# Patient Record
Sex: Female | Born: 1965 | Race: White | Hispanic: No | Marital: Married | Smoking: Never smoker
Health system: Southern US, Community
[De-identification: ages and names within clinical notes are randomized; demographics above are authoritative.]

## PROBLEM LIST (undated history)

## (undated) DIAGNOSIS — D649 Anemia, unspecified: Secondary | ICD-10-CM

## (undated) DIAGNOSIS — R7989 Other specified abnormal findings of blood chemistry: Secondary | ICD-10-CM

## (undated) DIAGNOSIS — Z973 Presence of spectacles and contact lenses: Secondary | ICD-10-CM

## (undated) HISTORY — PX: WISDOM TOOTH EXTRACTION: SHX21

## (undated) HISTORY — DX: Anemia, unspecified: D64.9

## (undated) HISTORY — DX: Other specified abnormal findings of blood chemistry: R79.89

## (undated) HISTORY — PX: GYNECOLOGIC CRYOSURGERY: SHX857

## (undated) HISTORY — PX: TONSILLECTOMY AND ADENOIDECTOMY: SHX28

## (undated) HISTORY — PX: CONVERTED PROCEDURE: SHX9999

## (undated) SURGERY — Surgical Case
Anesthesia: *Unknown

---

## 1997-12-20 HISTORY — PX: EYE SURGERY: SHX253

## 2000-05-31 ENCOUNTER — Other Ambulatory Visit: Admission: RE | Admit: 2000-05-31 | Discharge: 2000-05-31 | Payer: Self-pay | Admitting: Obstetrics and Gynecology

## 2001-06-28 ENCOUNTER — Other Ambulatory Visit: Admission: RE | Admit: 2001-06-28 | Discharge: 2001-06-28 | Payer: Self-pay | Admitting: Obstetrics and Gynecology

## 2002-05-09 ENCOUNTER — Other Ambulatory Visit: Admission: RE | Admit: 2002-05-09 | Discharge: 2002-05-09 | Payer: Self-pay | Admitting: Obstetrics and Gynecology

## 2003-05-21 ENCOUNTER — Other Ambulatory Visit: Admission: RE | Admit: 2003-05-21 | Discharge: 2003-05-21 | Payer: Self-pay | Admitting: Obstetrics and Gynecology

## 2003-10-04 ENCOUNTER — Ambulatory Visit (HOSPITAL_COMMUNITY): Admission: RE | Admit: 2003-10-04 | Discharge: 2003-10-04 | Payer: Self-pay | Admitting: Obstetrics and Gynecology

## 2003-10-04 ENCOUNTER — Encounter: Payer: Self-pay | Admitting: Obstetrics and Gynecology

## 2004-05-26 ENCOUNTER — Other Ambulatory Visit: Admission: RE | Admit: 2004-05-26 | Discharge: 2004-05-26 | Payer: Self-pay | Admitting: Obstetrics and Gynecology

## 2004-10-05 ENCOUNTER — Ambulatory Visit (HOSPITAL_COMMUNITY): Admission: RE | Admit: 2004-10-05 | Discharge: 2004-10-05 | Payer: Self-pay | Admitting: Obstetrics and Gynecology

## 2005-07-13 ENCOUNTER — Other Ambulatory Visit: Admission: RE | Admit: 2005-07-13 | Discharge: 2005-07-13 | Payer: Self-pay | Admitting: Obstetrics and Gynecology

## 2006-08-29 ENCOUNTER — Other Ambulatory Visit: Admission: RE | Admit: 2006-08-29 | Discharge: 2006-08-29 | Payer: Self-pay | Admitting: Obstetrics and Gynecology

## 2006-12-06 ENCOUNTER — Ambulatory Visit (HOSPITAL_COMMUNITY): Admission: RE | Admit: 2006-12-06 | Discharge: 2006-12-06 | Payer: Self-pay | Admitting: Obstetrics and Gynecology

## 2007-10-24 ENCOUNTER — Other Ambulatory Visit: Admission: RE | Admit: 2007-10-24 | Discharge: 2007-10-24 | Payer: Self-pay | Admitting: Obstetrics and Gynecology

## 2008-02-22 ENCOUNTER — Other Ambulatory Visit: Payer: Self-pay | Admitting: Gastroenterology

## 2008-02-23 LAB — HM MAMMOGRAPHY

## 2008-11-25 ENCOUNTER — Other Ambulatory Visit: Admission: RE | Admit: 2008-11-25 | Discharge: 2008-11-25 | Payer: Self-pay | Admitting: Obstetrics and Gynecology

## 2010-10-08 ENCOUNTER — Ambulatory Visit: Payer: Self-pay | Admitting: Primary Care

## 2010-10-11 NOTE — Progress Notes (Signed)
 Reason For Visit   Ms. Carrie Munoz is in the office today with complaints of having red eyes, cough   and cold for the past week.  HPI   Healthy 44 yo WF returns to discuss persistent redness in L eye.  She did   have some preceding URI sx, with nasal congestion/drip, stuffiness,   scratchy throat/cough, but that's subsiding.  Never feverish, no purulent   secretions.  Now noting redness mainly medial to iris, only L eye, more   than a week.  No sticky secretions upon awakening, minimal itch/discomfort.    Vision seems okay, no photophobia.     Meds reviewed.  Allergies   No Known Drug Allergy.  Current Meds   Centrum Tablet;TAKE 1 TABLET DAILY.; RPT.  Personal Hx   Nonsmoker, no specific sick exposure.  Vital Signs   Recorded by [email or web address] on 08 Oct 2010 12:53 PM  BP:105/58,  LUE,  Sitting,   HR: 68 b/min,  Apical, Regular,   Resp: 16 r/min, Normal.  Physical Exam   PERL, no apparent medial opacity on funduscopic exam, no asymmetry of   subjective sensation or objective pressure with palpation of eyes through   closed lids.  Erythema is medial as above, lids wnl, no exudate or regional   adenopathy apparent.  Assessment   Urged to have prompt ophth eval, as cannot explain this as simple viral   syndrome given focal nature, persistence, minimal sx.  Need to r/o deeper   inflammatory process in need of intervention.  No systemic process apparent   for now.  She has eye specialist and will call them herself, let me know if   unable to get in this week.  Signature   Electronically signed by: Rosezena Sensor  M.D.; 10/11/2010 11:53 AM EST;   Chartered loss adjuster.

## 2010-12-23 LAB — HM PAP SMEAR

## 2011-01-14 ENCOUNTER — Ambulatory Visit: Payer: Self-pay | Admitting: Primary Care

## 2011-02-10 ENCOUNTER — Ambulatory Visit: Payer: Self-pay | Admitting: Primary Care

## 2011-02-10 ENCOUNTER — Encounter: Payer: Self-pay | Admitting: Primary Care

## 2011-02-10 NOTE — Progress Notes (Signed)
 Reason For Visit   Carrie Munoz is in the office today with complaints of having headaches once   monthly at the same time of month lasting 10-12 hours with nausea, photo   sensitivity, sound sensitivity for at least the past 3 months.  HPI   [name] has had episodic headaches of the current variety for many years,   back to her teens.  They come sporadically, with no clear relation to   menses or intake that she has been able to discern.  She often awakens with   them, finds that otc analgesic may help if taken in time, but otherwise   becomes photophobic with N/V, inability to keep anything down, losing most   of the day before easing overnight.     She notes no visual aura.  She notes no focal motor or sensory deficits   during episodes, and does recover completely in between.  There have been   more frequent episodes in recent months, prompting this visit.  She's never   tried Rx.     Mother and sister also suffer with bad headaches.  Allergies   No Known Drug Allergy.  Current Meds   Centrum Tablet;TAKE 1 TABLET DAILY.; RPT.  Personal Hx   Nonsmoker, not much caffeine, and social alcohol minimal.  Sleep cycles may   be erratic, and stressors perhaps trigger?.  ROS   Menses have largely ceased in the past year, erratic prior to that.  No   change in baseline visual acuity, speech, coordination, strength, energy or   appetite.  Vital Signs   Recorded by [email or web address] on 10 Feb 2011 10:53 AM  BP:102/80,  LUE,  Sitting,   HR: 64 b/min,  Apical, Regular.  Physical Exam   Normal central fundi, pupils, EOM's.  Face symmetric.  Neck supple.  Nl   tone and strength functionally, and no apparent sensory deficits, nl gait.    Fully alert, articulate, attentive.  Assessment   Common migraine, uncomplicated to date.  She'd like triptan trial, and is   not sure of ins pharmacy coverage.  Discussed options of injectible, MLT,   nasal spray.  She prefers the MLT if can afford, and will check with   pharmacist re benefits, out of  pocket expense, get back to me.     Discussed HIV testing per [place].  She did have transfusion many years ago,   and also was turned down by [employer] on last occasion because of   conflicting, inconclusive results on blood screen.  Will update hepatitis   virus serol and get HIV, after consent signed today.  Signature   Electronically signed by: Rosezena Sensor  M.D.; 02/10/2011 6:13 PM EST; Chartered loss adjuster.

## 2011-02-11 LAB — HEPATITIS B SURFACE ANTIGEN: HBV S Ag: NEGATIVE

## 2011-02-11 LAB — HEPATITIS B SURFACE ANTIBODY
HBV S Ab Quant: 0 IU/L
HBV S Ab: NEGATIVE

## 2011-02-11 LAB — HIV-1 AND 2 AB: HIV 1&2 Ab screen: NEGATIVE

## 2011-02-11 LAB — HEPATITIS C ANTIBODY: Hep C Ab: NEGATIVE

## 2011-02-15 ENCOUNTER — Other Ambulatory Visit: Payer: Self-pay | Admitting: Obstetrics and Gynecology

## 2011-02-15 DIAGNOSIS — Z1231 Encounter for screening mammogram for malignant neoplasm of breast: Secondary | ICD-10-CM

## 2011-02-19 ENCOUNTER — Ambulatory Visit (HOSPITAL_COMMUNITY)
Admission: RE | Admit: 2011-02-19 | Discharge: 2011-02-19 | Disposition: A | Discharge status: Home / Self Care | Payer: BC Managed Care – PPO | Source: Ambulatory Visit | Attending: Obstetrics and Gynecology | Admitting: Obstetrics and Gynecology

## 2011-02-19 DIAGNOSIS — Z1231 Encounter for screening mammogram for malignant neoplasm of breast: Secondary | ICD-10-CM

## 2011-08-12 ENCOUNTER — Telehealth: Payer: Self-pay | Admitting: Primary Care

## 2011-08-12 NOTE — Telephone Encounter (Signed)
appt scheduled

## 2011-08-12 NOTE — Telephone Encounter (Signed)
We would need to meet and discuss the initiation of any new Rx.  Thanks.

## 2011-08-12 NOTE — Telephone Encounter (Signed)
Seila called with concerns of having migraine headaches once monthly.  Not having mensus.  Spoke with friends who take imitrex and she wants to know if she can have Rx for it???  Apt or Rx to pharmacy of record??  Allayne Stack

## 2011-08-26 ENCOUNTER — Encounter: Payer: Self-pay | Admitting: Primary Care

## 2011-08-26 ENCOUNTER — Ambulatory Visit: Payer: Self-pay | Admitting: Primary Care

## 2011-08-26 VITALS — BP 108/76 | HR 66 | Ht 62.0 in | Wt 152.0 lb

## 2011-08-26 DIAGNOSIS — G43009 Migraine without aura, not intractable, without status migrainosus: Secondary | ICD-10-CM

## 2011-08-26 MED ORDER — SUMATRIPTAN SUCCINATE 25 MG PO TABS *I*
25.0000 mg | ORAL_TABLET | ORAL | Status: AC | PRN
Start: 2011-08-26 — End: ?

## 2011-08-28 DIAGNOSIS — G43009 Migraine without aura, not intractable, without status migrainosus: Secondary | ICD-10-CM | POA: Insufficient documentation

## 2011-08-28 NOTE — Progress Notes (Signed)
Subjective:     Patient ID: Carrie Munoz is a 45 y.o. female.    HPI  With h/o headaches for years, never clearly related to menses or any other specific trigger, but sometimes disabling with photophobia, N/V and "a lost day", she has often gotten some relief from OTC analgesic if taken in time.  A recent episode was while with friend that had Imitrex; Carrie Munoz took a 25 mg dose and was amazed at how well it worked, the HA totally gone within 45 min and never returned.  She would like her own Rx.    Caffeine is 3-4c daily.  She has addressed sleep, keeping all seven nights the same, "good sleeper".  She rarely uses analgesic, so no rebound relevant.  Alcohol minimal/rare, and many years since smoked.    Her headaches have no prodrome of visual or focal neurol flavor.  She's had them for years, but never tried Rx.    Patient's medications, allergies, past medical, surgical, social and family histories were reviewed and updated as appropriate.  No new stressors.    Review of Systems  No change in menses, no dizziness/vertigo, no exertional sx of concern and no recent visual spells/loss.        Objective:   Physical Exam  Normal carotid pulses without bruit, normal BP as above.        Assessment:      Common migraine, apparent response to low-dose sumatriptan.  We discussed the very rare vascular side effects, and her CV risk profile makes such a surprise highly unlikely.  Reviewed headache hygiene, option of caffeine weaning.  Infrequent headaches, so no prophylaxis for now.    Gr than 50% of this 25+ min visit was counsel.        Plan:      As above.*

## 2013-03-12 ENCOUNTER — Other Ambulatory Visit: Payer: Self-pay | Admitting: Obstetrics and Gynecology

## 2013-03-12 DIAGNOSIS — Z1231 Encounter for screening mammogram for malignant neoplasm of breast: Secondary | ICD-10-CM

## 2013-03-16 ENCOUNTER — Ambulatory Visit (HOSPITAL_COMMUNITY)
Admission: RE | Admit: 2013-03-16 | Discharge: 2013-03-16 | Disposition: A | Discharge status: Home / Self Care | Payer: BC Managed Care – PPO | Source: Ambulatory Visit | Attending: Obstetrics and Gynecology | Admitting: Obstetrics and Gynecology

## 2013-03-16 DIAGNOSIS — Z1231 Encounter for screening mammogram for malignant neoplasm of breast: Secondary | ICD-10-CM | POA: Insufficient documentation

## 2013-03-16 LAB — HM MAMMOGRAPHY

## 2013-06-28 ENCOUNTER — Telehealth: Payer: Self-pay | Admitting: Obstetrics and Gynecology

## 2013-06-28 NOTE — Telephone Encounter (Signed)
Please call patient once done

## 2013-06-28 NOTE — Telephone Encounter (Signed)
Patient request birth control refills due to new insurance. [facility] needs [facility]

## 2013-06-28 NOTE — Telephone Encounter (Signed)
Spoke to patient who now uses [employer] delivery. Pt uses trisprintec & mail order will only give her trisprintec generic. Pt says she cant use the generic because when she did in the past, she had a lot of trouble with it & had bleeding. The mail order pharmacy told her that her drs office needs to call them at [phone] & ask for a mail order override so that way her [employer] will cover it for her to get it at her local pharmacy ([facility] [address]) so she can get the brand name Trisprintec at a lower price. Patient would like a call placed to [employer] at the number above. Pts last aex was 02/16/13 with Dr Tresa Res

## 2013-06-29 MED ORDER — NORGESTIM-ETH ESTRAD TRIPHASIC 0.18/0.215/0.25 MG-35 MCG PO TABS: 1.0000 | ORAL_TABLET | Freq: Every day | ORAL | Status: DC

## 2013-06-29 NOTE — Telephone Encounter (Signed)
Has pt had an AnEx recently and why doesn't she have an active rx for her OC'S?

## 2013-06-29 NOTE — Telephone Encounter (Signed)
This message stream isn't seen on the med refill request, so I didn't know this.  So yes, it's all fine to refill the rx.

## 2013-06-29 NOTE — Telephone Encounter (Signed)
See phone note below from patient. [employer] insurance called as requested per patient. Was told by representative that no mail order override was needed. [facility] [address] notified of this. Pharmacy to run Tri-sprintec through and notify patient.   patient last AEX 02/16/2013

## 2013-06-29 NOTE — Telephone Encounter (Signed)
Rx for Tri-sprintec  [facility] [address]. Paper chart shows last AEX 01/2013. Patient now has new insurance that required over ride per patient.

## 2013-07-12 ENCOUNTER — Telehealth: Payer: Self-pay

## 2013-08-03 ENCOUNTER — Telehealth: Payer: Self-pay | Admitting: *Deleted

## 2013-08-03 NOTE — Telephone Encounter (Signed)
[facility] States the Rx for TriSprentec does not require an mail order override or PA.

## 2013-08-03 NOTE — Telephone Encounter (Signed)
Left message on CB# of need to call office concerning Rx medication TriSpentec brand name only.

## 2013-08-10 ENCOUNTER — Telehealth: Payer: Self-pay | Admitting: Obstetrics and Gynecology

## 2013-08-10 NOTE — Telephone Encounter (Signed)
Patient is calling Natasha Villegas with a few more questions and an update pertaining to her prescription and [employer].

## 2013-08-10 NOTE — Telephone Encounter (Signed)
Routing to Sue.

## 2013-08-14 NOTE — Telephone Encounter (Signed)
LMTCB  aa 

## 2014-01-25 ENCOUNTER — Telehealth: Payer: Self-pay | Admitting: *Deleted

## 2014-01-25 MED ORDER — NORGESTIM-ETH ESTRAD TRIPHASIC 0.18/0.215/0.25 MG-35 MCG PO TABS: 1.0000 | ORAL_TABLET | Freq: Every day | ORAL | Status: DC

## 2014-01-25 NOTE — Telephone Encounter (Signed)
Last refilled: 06/29/13 #1 pack with 7 refills Last AEX: 02/16/13 Current AEX: 02/20/14 with Dr. Newton Pigg Previfem #28/0 refills sent to last patient until AEX  Please Advise.  Encounter Closed.

## 2014-02-20 ENCOUNTER — Ambulatory Visit: Payer: Self-pay | Admitting: Obstetrics and Gynecology

## 2014-02-20 ENCOUNTER — Encounter: Payer: Self-pay | Admitting: Obstetrics and Gynecology

## 2014-02-20 ENCOUNTER — Ambulatory Visit (INDEPENDENT_AMBULATORY_CARE_PROVIDER_SITE_OTHER): Payer: Managed Care, Other (non HMO) | Admitting: Obstetrics and Gynecology

## 2014-02-20 VITALS — BP 130/84 | HR 84 | Ht 65.25 in | Wt 144.0 lb

## 2014-02-20 DIAGNOSIS — Z0000 Encounter for general adult medical examination without abnormal findings: Secondary | ICD-10-CM

## 2014-02-20 DIAGNOSIS — Z01419 Encounter for gynecological examination (general) (routine) without abnormal findings: Secondary | ICD-10-CM

## 2014-02-20 DIAGNOSIS — R8281 Pyuria: Secondary | ICD-10-CM

## 2014-02-20 DIAGNOSIS — R82998 Other abnormal findings in urine: Secondary | ICD-10-CM

## 2014-02-20 LAB — POCT URINALYSIS DIPSTICK
Bilirubin, UA: NEGATIVE
Blood, UA: NEGATIVE
Glucose, UA: NEGATIVE
Ketones, UA: NEGATIVE
NITRITE UA: NEGATIVE
PROTEIN UA: NEGATIVE
UROBILINOGEN UA: NEGATIVE
pH, UA: 5

## 2014-02-20 MED ORDER — NORGESTIM-ETH ESTRAD TRIPHASIC 0.18/0.215/0.25 MG-35 MCG PO TABS: 1.0000 | ORAL_TABLET | Freq: Every day | ORAL | Status: DC

## 2014-02-20 NOTE — Patient Instructions (Signed)

## 2014-02-20 NOTE — Progress Notes (Signed)
Patient ID: Natasha Villegas, female   DOB: 08/05/1966, 48 y.o.   MRN: [id number] 48 y.o.   Married    Caucasian   female   G2P2003   here for annual exam.   Happy on OCPs.  Menses a little heavier.   Patient's last menstrual period was 02/07/2014.          Sexually active: yes  The current method of family planning is Trisprintec. Exercising:  None Last mammogram:  03/16/13, WNL [facility]  Prefers to do every two years.  Last pap smear:  12/23/10, WNL History of abnormal pap:  none Smoking:  never Alcohol:  none Last colonoscopy:  N/A Last Bone Density:  N/A Last tetanus shot:  08/2011 Last cholesterol check:  01/2013.    Declines blood work today.   Hgb:                Urine:  1+ leuks, pH 5.0 - asymptomatic.   Family History  Problem Relation Age of Onset  . Hypertension Father   . Heart attack Father     bypass surgery    There are no active problems to display for this patient.   No past medical history on file.  Past Surgical History  Procedure Laterality Date  . Gynecologic cryosurgery      d/t heavy vaginal discharge  . Tonsillectomy and adenoidectomy    . Wisdom tooth extraction      Allergies: Review of patient's allergies indicates no known allergies.  Current Outpatient Prescriptions  Medication Sig Dispense Refill  . Norgestimate-Ethinyl Estradiol Triphasic (TRI-SPRINTEC) 0.18/0.215/0.25 MG-35 MCG tablet Take 1 tablet by mouth daily. Brand name only per patient request. 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  1 Package  0   No current facility-administered medications for this visit.    ROS: Pertinent items are noted in HPI.  Social Hx:  Married.   Exam:    BP 130/84  Pulse 84  Ht 5' 5.25" (1.657 m)  Wt 144 lb (65.318 kg)  BMI 23.79 kg/m2  LMP 02/07/2014   Wt Readings from Last 3 Encounters:  02/20/14 144 lb (65.318 kg)     Ht Readings from Last 3 Encounters:  02/20/14 5' 5.25" (1.657 m)    General appearance: alert, cooperative and appears stated  age Head: Normocephalic, without obvious abnormality, atraumatic Neck: no adenopathy, supple, symmetrical, trachea midline and thyroid not enlarged, symmetric, no tenderness/mass/nodules Lungs: clear to auscultation bilaterally Breasts: Inspection negative, No nipple retraction or dimpling, No nipple discharge or bleeding, No axillary or supraclavicular adenopathy, Normal to palpation without dominant masses Heart: regular rate and rhythm Abdomen: soft, non-tender; bowel sounds normal; no masses,  no organomegaly Extremities: extremities normal, atraumatic, no cyanosis or edema Skin: Skin color, texture, turgor normal. No rashes or lesions Lymph nodes: Cervical, supraclavicular, and axillary nodes normal. No abnormal inguinal nodes palpated Neurologic: Grossly normal   Pelvic: External genitalia:  no lesions              Urethra:  normal appearing urethra with no masses, tenderness or lesions              Bartholins and Skenes: normal                 Vagina: normal appearing vagina with normal color and discharge, no lesions              Cervix: large ectropion.  Bleeds easily with pap.  Nodular anterior cervix.  1.0 cm  anterior LUS fibroid.               Pap taken: yes and HR HPV        Bimanual Exam:  Uterus:  uterus is normal size, shape, consistency and nontender                                      Adnexa: normal adnexa in size, nontender and no masses                                      Rectovaginal: Confirms                                      Anus:  normal sphincter tone, no lesions  A: normal menopausal exam Pyuria.  Possible UTI. Cervical hood?    P:     Mammogram recommended yearly starting at age 46. I will renew Rx for Trisprintec for one year.  See EPIC. I discussed with patient that if she declines doing mammogram yearly starting next year, we may need to discontinue her hormonal birth control. pap smear and high risk HPV testing today.  Urine culture. return  annually or prn     An After Visit Summary was printed and given to the patient.

## 2014-02-21 LAB — IPS PAP TEST WITH HPV

## 2014-02-21 LAB — URINE CULTURE
COLONY COUNT: NO GROWTH
Organism ID, Bacteria: NO GROWTH

## 2014-02-22 ENCOUNTER — Telehealth: Payer: Self-pay

## 2014-02-22 NOTE — Telephone Encounter (Signed)
Message copied by Lowella Fairy on Fri Feb 22, 2014 11:14 AM ------      Message from: [place], BROOK E      Created: Fri Feb 22, 2014  5:50 AM       Please report negative urine culture to patient. ------

## 2014-02-22 NOTE — Telephone Encounter (Signed)
LMOVM to call for lab and pap results.

## 2014-02-28 ENCOUNTER — Encounter (INDEPENDENT_AMBULATORY_CARE_PROVIDER_SITE_OTHER): Payer: Self-pay | Admitting: Surgery

## 2014-03-07 ENCOUNTER — Ambulatory Visit (INDEPENDENT_AMBULATORY_CARE_PROVIDER_SITE_OTHER): Payer: Managed Care, Other (non HMO) | Admitting: General Surgery

## 2014-03-11 ENCOUNTER — Encounter (INDEPENDENT_AMBULATORY_CARE_PROVIDER_SITE_OTHER): Payer: Self-pay | Admitting: Surgery

## 2014-03-11 ENCOUNTER — Ambulatory Visit (INDEPENDENT_AMBULATORY_CARE_PROVIDER_SITE_OTHER): Payer: Managed Care, Other (non HMO) | Admitting: Surgery

## 2014-03-11 VITALS — BP 126/84 | HR 75 | Temp 97.8°F | Resp 16 | Ht 65.0 in | Wt 146.0 lb

## 2014-03-11 DIAGNOSIS — C4371 Malignant melanoma of right lower limb, including hip: Secondary | ICD-10-CM | POA: Insufficient documentation

## 2014-03-11 DIAGNOSIS — C4370 Malignant melanoma of unspecified lower limb, including hip: Secondary | ICD-10-CM

## 2014-03-11 NOTE — Progress Notes (Signed)
Patient ID: Natasha Villegas, female   DOB: 12/26/1965, 48 y.o.   MRN: [id number]  Chief Complaint  Patient presents with  . eval rt leg melanoma    HPI Natasha Villegas is a 48 y.o. female.  Referred by Dr. Frederick Lupton for melanoma of the right leg  HPI This is a healthy 47-year-old female with no significant medical history who presents after a recent skin biopsy by dermatology. She had a dark mole on her anterior right leg that seems to have changed over the last year. She underwent a shave biopsy on 02/22/14. This returned a diagnosis of malignant melanoma, superficial spreading with a maximum tumor thickness of 0.89 mm Clark's level IV. There is no ulceration. The peripheral margins are clear but less than 1 mm. The deep margin is free of tumor. No lymphovascular invasion. She is now referred for wide excision.  History reviewed. No pertinent past medical history.  Past Surgical History  Procedure Laterality Date  . Gynecologic cryosurgery      d/t heavy vaginal discharge  . Tonsillectomy and adenoidectomy    . Wisdom tooth extraction      Family History  Problem Relation Age of Onset  . Hypertension Father   . Heart attack Father     bypass surgery  . Heart disease Father     Social History History  Substance Use Topics  . Smoking status: Never Smoker   . Smokeless tobacco: Never Used  . Alcohol Use: No    No Known Allergies  Current Outpatient Prescriptions  Medication Sig Dispense Refill  . Norgestimate-Ethinyl Estradiol Triphasic (TRI-SPRINTEC) 0.18/0.215/0.25 MG-35 MCG tablet Take 1 tablet by mouth daily. Brand name only per patient request. 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  1 Package  11   No current facility-administered medications for this visit.    Review of Systems Review of Systems  Constitutional: Negative for fever, chills and unexpected weight change.  HENT: Negative for congestion, hearing loss, sore throat, trouble swallowing and voice change.   Eyes:  Negative for visual disturbance.  Respiratory: Negative for cough and wheezing.   Cardiovascular: Negative for chest pain, palpitations and leg swelling.  Gastrointestinal: Negative for nausea, vomiting, abdominal pain, diarrhea, constipation, blood in stool, abdominal distention and anal bleeding.  Genitourinary: Negative for hematuria, vaginal bleeding and difficulty urinating.  Musculoskeletal: Negative for arthralgias.  Skin: Positive for color change. Negative for rash and wound.  Neurological: Negative for seizures, syncope and headaches.  Hematological: Negative for adenopathy. Does not bruise/bleed easily.  Psychiatric/Behavioral: Negative for confusion.    Blood pressure 126/84, pulse 75, temperature 97.8 F (36.6 C), temperature source Oral, resp. rate 16, height 5' 5" (1.651 m), weight 146 lb (66.225 kg), last menstrual period 02/07/2014.  Physical Exam Physical Exam WDWN in NAD Right anterolateral leg - 1 cm round open wound; no sign of infection No surrounding suspicious skin lesions  Data Reviewed Path report  Assessment    Superficial spreading melanoma 0.89 mm thickness -      Plan    Wide excision with 1 cm minimum margins.  The surgical procedure has been discussed with the patient.  Potential risks, benefits, alternative treatments, and expected outcomes have been explained.  All of the patient's questions at this time have been answered.  The likelihood of reaching the patient's treatment goal is good.  The patient understand the proposed surgical procedure and wishes to proceed.         Rolland Steinert K. 03/11/2014, 12:17 PM    

## 2014-03-15 ENCOUNTER — Encounter (HOSPITAL_BASED_OUTPATIENT_CLINIC_OR_DEPARTMENT_OTHER): Payer: Self-pay | Admitting: *Deleted

## 2014-03-15 NOTE — Progress Notes (Signed)
Healthy no meds-no labs needed

## 2014-03-21 ENCOUNTER — Encounter (HOSPITAL_BASED_OUTPATIENT_CLINIC_OR_DEPARTMENT_OTHER): Payer: Managed Care, Other (non HMO) | Admitting: Anesthesiology

## 2014-03-21 ENCOUNTER — Ambulatory Visit (HOSPITAL_BASED_OUTPATIENT_CLINIC_OR_DEPARTMENT_OTHER)
Admission: RE | Admit: 2014-03-21 | Discharge: 2014-03-21 | Disposition: A | Discharge status: Home / Self Care | Payer: Managed Care, Other (non HMO) | Source: Ambulatory Visit | Attending: Surgery | Admitting: Surgery

## 2014-03-21 ENCOUNTER — Encounter (HOSPITAL_BASED_OUTPATIENT_CLINIC_OR_DEPARTMENT_OTHER)
Admission: RE | Disposition: A | Discharge status: Home / Self Care | Payer: Self-pay | Source: Ambulatory Visit | Attending: Surgery

## 2014-03-21 ENCOUNTER — Ambulatory Visit (HOSPITAL_BASED_OUTPATIENT_CLINIC_OR_DEPARTMENT_OTHER): Payer: Managed Care, Other (non HMO) | Admitting: Anesthesiology

## 2014-03-21 ENCOUNTER — Encounter (HOSPITAL_BASED_OUTPATIENT_CLINIC_OR_DEPARTMENT_OTHER): Payer: Self-pay | Admitting: Anesthesiology

## 2014-03-21 DIAGNOSIS — C4371 Malignant melanoma of right lower limb, including hip: Secondary | ICD-10-CM

## 2014-03-21 DIAGNOSIS — C4370 Malignant melanoma of unspecified lower limb, including hip: Secondary | ICD-10-CM | POA: Insufficient documentation

## 2014-03-21 HISTORY — DX: Presence of spectacles and contact lenses: Z97.3

## 2014-03-21 HISTORY — PX: MASS EXCISION: SHX2000

## 2014-03-21 LAB — POCT HEMOGLOBIN-HEMACUE: HEMOGLOBIN: 13.3 g/dL (ref 12.0–15.0)

## 2014-03-21 SURGERY — EXCISION MASS
Anesthesia: General | Site: Leg Lower | Laterality: Right

## 2014-03-21 MED ORDER — PROPOFOL 10 MG/ML IV BOLUS
INTRAVENOUS | Status: DC | PRN
  Administered 2014-03-21: 200 mg via INTRAVENOUS

## 2014-03-21 MED ORDER — HYDROMORPHONE HCL PF 1 MG/ML IJ SOLN: 0.2500 mg | INTRAMUSCULAR | Status: DC | PRN

## 2014-03-21 MED ORDER — MIDAZOLAM HCL 5 MG/5ML IJ SOLN
INTRAMUSCULAR | Status: DC | PRN
  Administered 2014-03-21: 2 mg via INTRAVENOUS

## 2014-03-21 MED ORDER — FENTANYL CITRATE 0.05 MG/ML IJ SOLN
INTRAMUSCULAR | Status: DC | PRN
  Administered 2014-03-21: 50 ug via INTRAVENOUS

## 2014-03-21 MED ORDER — MIDAZOLAM HCL 2 MG/2ML IJ SOLN: 1.0000 mg | INTRAMUSCULAR | Status: DC | PRN

## 2014-03-21 MED ORDER — METOCLOPRAMIDE HCL 5 MG/ML IJ SOLN: 10.0000 mg | Freq: Once | INTRAMUSCULAR | Status: DC | PRN

## 2014-03-21 MED ORDER — MIDAZOLAM HCL 2 MG/2ML IJ SOLN
INTRAMUSCULAR | Status: AC
  Filled 2014-03-21: qty 2

## 2014-03-21 MED ORDER — OXYCODONE-ACETAMINOPHEN 5-325 MG PO TABS: 1.0000 | ORAL_TABLET | ORAL | Status: DC | PRN

## 2014-03-21 MED ORDER — FENTANYL CITRATE 0.05 MG/ML IJ SOLN
INTRAMUSCULAR | Status: AC
  Filled 2014-03-21: qty 8

## 2014-03-21 MED ORDER — CEFAZOLIN SODIUM-DEXTROSE 2-3 GM-% IV SOLR
INTRAVENOUS | Status: AC
  Filled 2014-03-21: qty 50

## 2014-03-21 MED ORDER — OXYCODONE HCL 5 MG/5ML PO SOLN: 5.0000 mg | Freq: Once | ORAL | Status: DC | PRN

## 2014-03-21 MED ORDER — ONDANSETRON HCL 4 MG/2ML IJ SOLN
INTRAMUSCULAR | Status: DC | PRN
  Administered 2014-03-21: 4 mg via INTRAVENOUS

## 2014-03-21 MED ORDER — METOCLOPRAMIDE HCL 5 MG/ML IJ SOLN
INTRAMUSCULAR | Status: DC | PRN
  Administered 2014-03-21: 10 mg via INTRAVENOUS

## 2014-03-21 MED ORDER — CHLORHEXIDINE GLUCONATE 4 % EX LIQD: 1.0000 "application " | Freq: Once | CUTANEOUS | Status: DC

## 2014-03-21 MED ORDER — OXYCODONE HCL 5 MG PO TABS: 5.0000 mg | ORAL_TABLET | Freq: Once | ORAL | Status: DC | PRN

## 2014-03-21 MED ORDER — LACTATED RINGERS IV SOLN
INTRAVENOUS | Status: DC
  Administered 2014-03-21 (×2): via INTRAVENOUS

## 2014-03-21 MED ORDER — DEXAMETHASONE SODIUM PHOSPHATE 4 MG/ML IJ SOLN
INTRAMUSCULAR | Status: DC | PRN
  Administered 2014-03-21: 10 mg via INTRAVENOUS

## 2014-03-21 MED ORDER — BUPIVACAINE-EPINEPHRINE PF 0.25-1:200000 % IJ SOLN
INTRAMUSCULAR | Status: AC
  Filled 2014-03-21: qty 30

## 2014-03-21 MED ORDER — BUPIVACAINE-EPINEPHRINE 0.25% -1:200000 IJ SOLN
INTRAMUSCULAR | Status: DC | PRN
  Administered 2014-03-21: 10 mL

## 2014-03-21 MED ORDER — FENTANYL CITRATE 0.05 MG/ML IJ SOLN: 50.0000 ug | INTRAMUSCULAR | Status: DC | PRN

## 2014-03-21 MED ORDER — LIDOCAINE HCL (CARDIAC) 20 MG/ML IV SOLN
INTRAVENOUS | Status: DC | PRN
  Administered 2014-03-21: 60 mg via INTRAVENOUS

## 2014-03-21 MED ORDER — CEFAZOLIN SODIUM-DEXTROSE 2-3 GM-% IV SOLR
2.0000 g | INTRAVENOUS | Status: AC
  Administered 2014-03-21: 2 g via INTRAVENOUS

## 2014-03-21 SURGICAL SUPPLY — 48 items
APL SKNCLS STERI-STRIP NONHPOA (GAUZE/BANDAGES/DRESSINGS) ×1
BANDAGE ELASTIC 4 VELCRO ST LF (GAUZE/BANDAGES/DRESSINGS) ×1 IMPLANT
BENZOIN TINCTURE PRP APPL 2/3 (GAUZE/BANDAGES/DRESSINGS) ×2 IMPLANT
BLADE SURG 15 STRL LF DISP TIS (BLADE) ×1 IMPLANT
BLADE SURG 15 STRL SS (BLADE) ×2
BLADE SURG ROTATE 9660 (MISCELLANEOUS) IMPLANT
BNDG GAUZE ELAST 4 BULKY (GAUZE/BANDAGES/DRESSINGS) ×1 IMPLANT
CANISTER SUCT 1200ML W/VALVE (MISCELLANEOUS) IMPLANT
CHLORAPREP W/TINT 26ML (MISCELLANEOUS) ×1 IMPLANT
COVER MAYO STAND STRL (DRAPES) ×2 IMPLANT
COVER TABLE BACK 60X90 (DRAPES) ×2 IMPLANT
DECANTER SPIKE VIAL GLASS SM (MISCELLANEOUS) IMPLANT
DRAPE PED LAPAROTOMY (DRAPES) ×2 IMPLANT
DRAPE UTILITY XL STRL (DRAPES) ×2 IMPLANT
ELECT COATED BLADE 2.86 ST (ELECTRODE) ×2 IMPLANT
ELECT REM PT RETURN 9FT ADLT (ELECTROSURGICAL) ×2
ELECTRODE REM PT RTRN 9FT ADLT (ELECTROSURGICAL) ×1 IMPLANT
GAUZE XEROFORM 1X8 LF (GAUZE/BANDAGES/DRESSINGS) ×1 IMPLANT
GLOVE BIO SURGEON STRL SZ7 (GLOVE) ×2 IMPLANT
GLOVE BIOGEL PI IND STRL 7.0 (GLOVE) IMPLANT
GLOVE BIOGEL PI IND STRL 7.5 (GLOVE) ×1 IMPLANT
GLOVE BIOGEL PI INDICATOR 7.0 (GLOVE) ×1
GLOVE BIOGEL PI INDICATOR 7.5 (GLOVE) ×1
GLOVE ECLIPSE 7.0 STRL STRAW (GLOVE) ×1 IMPLANT
GOWN STRL REUS W/ TWL LRG LVL3 (GOWN DISPOSABLE) ×1 IMPLANT
GOWN STRL REUS W/TWL LRG LVL3 (GOWN DISPOSABLE) ×4
NDL HYPO 25X1 1.5 SAFETY (NEEDLE) ×1 IMPLANT
NEEDLE HYPO 25X1 1.5 SAFETY (NEEDLE) ×2 IMPLANT
NS IRRIG 1000ML POUR BTL (IV SOLUTION) IMPLANT
PACK BASIN DAY SURGERY FS (CUSTOM PROCEDURE TRAY) ×2 IMPLANT
PENCIL BUTTON HOLSTER BLD 10FT (ELECTRODE) ×2 IMPLANT
SLEEVE SURGEON STRL (DRAPES) ×1 IMPLANT
SPONGE GAUZE 4X4 12PLY STER LF (GAUZE/BANDAGES/DRESSINGS) ×1 IMPLANT
STRIP CLOSURE SKIN 1/2X4 (GAUZE/BANDAGES/DRESSINGS) ×2 IMPLANT
SUT ETHILON 2 0 FS 18 (SUTURE) ×1 IMPLANT
SUT ETHILON 3 0 PS 1 (SUTURE) ×2 IMPLANT
SUT MON AB 4-0 PC3 18 (SUTURE) ×1 IMPLANT
SUT PROLENE 6 0 P 1 18 (SUTURE) IMPLANT
SUT SILK 2 0 FS (SUTURE) IMPLANT
SUT VIC AB 3-0 SH 27 (SUTURE)
SUT VIC AB 3-0 SH 27X BRD (SUTURE) IMPLANT
SUT VICRYL 3-0 CR8 SH (SUTURE) ×1 IMPLANT
SYR CONTROL 10ML LL (SYRINGE) ×2 IMPLANT
TOWEL OR 17X24 6PK STRL BLUE (TOWEL DISPOSABLE) ×2 IMPLANT
TOWEL OR NON WOVEN STRL DISP B (DISPOSABLE) ×2 IMPLANT
TRAY DSU PREP LF (CUSTOM PROCEDURE TRAY) ×1 IMPLANT
TUBE CONNECTING 20X1/4 (TUBING) IMPLANT
YANKAUER SUCT BULB TIP NO VENT (SUCTIONS) IMPLANT

## 2014-03-21 NOTE — Transfer of Care (Signed)
Immediate Anesthesia Transfer of Care Note  Patient: Natasha Villegas  Procedure(s) Performed: Procedure(s): WIDE EXCISION MELANOMA RIGHT LEG (Right)  Patient Location: PACU  Anesthesia Type:General  Level of Consciousness: awake and patient cooperative  Airway & Oxygen Therapy: Patient Spontanous Breathing and Patient connected to face mask oxygen  Post-op Assessment: Report given to PACU RN and Post -op Vital signs reviewed and stable  Post vital signs: Reviewed and stable  Complications: No apparent anesthesia complications

## 2014-03-21 NOTE — Op Note (Signed)
Preop diagnosis: Superficial spreading malignant melanoma of the right anterior leg (0.89 mm) Postop diagnosis: Same Procedure performed: Wide excision of malignant melanoma of the right leg with 1 cm margins Surgeon:Dmani Mizer K. Anesthesia: Gen. LMA Indications: This is a 48 year old female who was recently diagnosed with a superficial spreading melanoma on shave biopsy. The biopsy had a negative deep margin but the lateral margins were close. Depth was 0.89 mm.  She presents now for excision with 1 cm margins.  Description of procedure: The patient was brought to the operating room and placed in a supine position on the operating room table. After an adequate level of general anesthesia was obtained, the patient's right leg was prepped with Betadine and draped in sterile fashion. A timeout was taken to ensure the proper patient proper procedure. The patient is a 1 cm open wound at the biopsy site. I used a marking pen to outline an ellipse around this biopsy site with a minimum of 1 cm margin. The used a scalpel to do a full-thickness skin excision of the ellipse. Cautery was then used to dissect the skin and subcutaneous fat off of the underlying fascia. The specimen was sent for pathologic examination. Cautery was then used to mobilize the skin and subcutaneous tissues both medially and laterally.  We mobilized fairly extensively at least to the midpoint laterally on each side. We then closed the subcutaneous space with interrupted 3-0 Vicryl sutures. The Center of the incision is still fairly tight. I close the skin with horizontal mattress sutures of 2-0 and 3-0 nylon.  The wound was dressed with Xeroform gauze and covered in dry gauze, Kerlix gauze, and an Ace wrap. All sponge, initially, and needle counts are correct. The patient was then extubated and brought to recovery in stable condition. All sponge, initially, and needle counts are correct.  Imogene Burn. Georgette Dover, MD, [facility]  Surgery  General/ Trauma Surgery  03/21/2014 3:11 PM

## 2014-03-21 NOTE — Anesthesia Postprocedure Evaluation (Signed)
Anesthesia Post Note  Patient: Natasha Villegas  Procedure(s) Performed: Procedure(s) (LRB): WIDE EXCISION MELANOMA RIGHT LEG (Right)  Anesthesia type: General  Patient location: PACU  Post pain: Pain level controlled  Post assessment: Patient's Cardiovascular Status Stable  Last Vitals:  Filed Vitals:   03/21/14 1538  BP:   Pulse: 107  Temp:   Resp: 20    Post vital signs: Reviewed and stable  Level of consciousness: alert  Complications: No apparent anesthesia complications

## 2014-03-21 NOTE — Discharge Instructions (Signed)
Keep leg elevated as much as possible In 48 hours you may shower.  When you shower, keep a dressing over the wound.  When you finish your shower, remove the old dressing.  Cover the wound with dry gauze, and then wrap the lower leg with the Ace wrap.  Use the pain medicine as needed.  Try to avoid constipation by using Milk of Magnesia.  Call the office if the wound begins to look infected (red, swollen, hot).  The wound will feel very tight for several weeks.  Call your surgeon if you experience:   1.  Fever over 101.0. 2.  Inability to urinate. 3.  Nausea and/or vomiting. 4.  Extreme swelling or bruising at the surgical site. 5.  Continued bleeding from the incision. 6.  Increased pain, redness or drainage from the incision. 7.  Problems related to your pain medication.  Post Anesthesia Home Care Instructions  Activity: Get plenty of rest for the remainder of the day. A responsible adult should stay with you for 24 hours following the procedure.  For the next 24 hours, DO NOT: -Drive a car -Paediatric nurse -Drink alcoholic beverages -Take any medication unless instructed by your physician -Make any legal decisions or sign important papers.  Meals: Start with liquid foods such as gelatin or soup. Progress to regular foods as tolerated. Avoid greasy, spicy, heavy foods. If nausea and/or vomiting occur, drink only clear liquids until the nausea and/or vomiting subsides. Call your physician if vomiting continues.  Special Instructions/Symptoms: Your throat may feel dry or sore from the anesthesia or the breathing tube placed in your throat during surgery. If this causes discomfort, gargle with warm salt water. The discomfort should disappear within 24 hours.

## 2014-03-21 NOTE — Interval H&P Note (Signed)
History and Physical Interval Note:  03/21/2014 1:05 PM  Natasha Villegas  has presented today for surgery, with the diagnosis of melanoma right leg 1cm  The various methods of treatment have been discussed with the patient and family. After consideration of risks, benefits and other options for treatment, the patient has consented to  Procedure(s): WIDE EXCISION MELANOMA RIGHT LEG (Right) as a surgical intervention .  The patient's history has been reviewed, patient examined, no change in status, stable for surgery.  I have reviewed the patient's chart and labs.  Questions were answered to the patient's satisfaction.     Cable Fearn K.

## 2014-03-21 NOTE — Addendum Note (Signed)
Addendum created 03/21/14 1619 by Mason edited: Anesthesia Flowsheet

## 2014-03-21 NOTE — H&P (View-Only) (Signed)
Patient ID: Natasha Villegas, female   DOB: [birth year], 48 y.o.   MRN: [id number]  Chief Complaint  Patient presents with  . eval rt leg melanoma    HPI Natasha Villegas is a 48 y.o. female.  Referred by Dr. Druscilla Brownie for melanoma of the right leg  HPI This is a healthy 48 year old female with no significant medical history who presents after a recent skin biopsy by dermatology. She had a dark mole on her anterior right leg that seems to have changed over the last year. She underwent a shave biopsy on 02/22/14. This returned a diagnosis of malignant melanoma, superficial spreading with a maximum tumor thickness of 0.89 mm Clark's level IV. There is no ulceration. The peripheral margins are clear but less than 1 mm. The deep margin is free of tumor. No lymphovascular invasion. She is now referred for wide excision.  History reviewed. No pertinent past medical history.  Past Surgical History  Procedure Laterality Date  . Gynecologic cryosurgery      d/t heavy vaginal discharge  . Tonsillectomy and adenoidectomy    . Wisdom tooth extraction      Family History  Problem Relation Age of Onset  . Hypertension Father   . Heart attack Father     bypass surgery  . Heart disease Father     Social History History  Substance Use Topics  . Smoking status: Never Smoker   . Smokeless tobacco: Never Used  . Alcohol Use: No    No Known Allergies  Current Outpatient Prescriptions  Medication Sig Dispense Refill  . Norgestimate-Ethinyl Estradiol Triphasic (TRI-SPRINTEC) 0.18/0.215/0.25 MG-35 MCG tablet Take 1 tablet by mouth daily. Brand name only per patient request. 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  1 Package  11   No current facility-administered medications for this visit.    Review of Systems Review of Systems  Constitutional: Negative for fever, chills and unexpected weight change.  HENT: Negative for congestion, hearing loss, sore throat, trouble swallowing and voice change.   Eyes:  Negative for visual disturbance.  Respiratory: Negative for cough and wheezing.   Cardiovascular: Negative for chest pain, palpitations and leg swelling.  Gastrointestinal: Negative for nausea, vomiting, abdominal pain, diarrhea, constipation, blood in stool, abdominal distention and anal bleeding.  Genitourinary: Negative for hematuria, vaginal bleeding and difficulty urinating.  Musculoskeletal: Negative for arthralgias.  Skin: Positive for color change. Negative for rash and wound.  Neurological: Negative for seizures, syncope and headaches.  Hematological: Negative for adenopathy. Does not bruise/bleed easily.  Psychiatric/Behavioral: Negative for confusion.    Blood pressure 126/84, pulse 75, temperature 97.8 F (36.6 C), temperature source Oral, resp. rate 16, height 5\' 5"  (1.651 m), weight 146 lb (66.225 kg), last menstrual period 02/07/2014.  Physical Exam Physical Exam WDWN in NAD Right anterolateral leg - 1 cm round open wound; no sign of infection No surrounding suspicious skin lesions  Data Reviewed Path report  Assessment    Superficial spreading melanoma 0.89 mm thickness -      Plan    Wide excision with 1 cm minimum margins.  The surgical procedure has been discussed with the patient.  Potential risks, benefits, alternative treatments, and expected outcomes have been explained.  All of the patient's questions at this time have been answered.  The likelihood of reaching the patient's treatment goal is good.  The patient understand the proposed surgical procedure and wishes to proceed.         Stanley Helmuth K. 03/11/2014, 12:17 PM

## 2014-03-21 NOTE — Anesthesia Preprocedure Evaluation (Signed)
Anesthesia Evaluation  Patient identified by MRN, date of birth, ID band Patient awake    Reviewed: Allergy & Precautions, H&P , NPO status , Patient's Chart, lab work & pertinent test results, reviewed documented beta blocker date and time   Airway Mallampati: II TM Distance: >3 FB Neck ROM: full    Dental   Pulmonary neg pulmonary ROS,  breath sounds clear to auscultation        Cardiovascular negative cardio ROS  Rhythm:regular     Neuro/Psych negative neurological ROS  negative psych ROS   GI/Hepatic negative GI ROS, Neg liver ROS,   Endo/Other  negative endocrine ROS  Renal/GU negative Renal ROS  negative genitourinary   Musculoskeletal   Abdominal   Peds  Hematology negative hematology ROS (+)   Anesthesia Other Findings See surgeon's H&P   Reproductive/Obstetrics negative OB ROS                           Anesthesia Physical Anesthesia Plan  ASA: I  Anesthesia Plan: General   Post-op Pain Management:    Induction: Intravenous  Airway Management Planned: LMA  Additional Equipment:   Intra-op Plan:   Post-operative Plan:   Informed Consent: I have reviewed the patients History and Physical, chart, labs and discussed the procedure including the risks, benefits and alternatives for the proposed anesthesia with the patient or authorized representative who has indicated his/her understanding and acceptance.   Dental Advisory Given  Plan Discussed with: CRNA and Surgeon  Anesthesia Plan Comments:         Anesthesia Quick Evaluation  

## 2014-03-21 NOTE — Anesthesia Procedure Notes (Signed)
Procedure Name: LMA Insertion Date/Time: 03/21/2014 2:25 PM Performed by: Kelia Gibbon Pre-anesthesia Checklist: Patient identified, Emergency Drugs available, Suction available and Patient being monitored Patient Re-evaluated:Patient Re-evaluated prior to inductionOxygen Delivery Method: Circle System Utilized Preoxygenation: Pre-oxygenation with 100% oxygen Intubation Type: IV induction Ventilation: Mask ventilation without difficulty LMA: LMA inserted LMA Size: 4.0 Number of attempts: 1 Airway Equipment and Method: bite block Placement Confirmation: positive ETCO2 Tube secured with: Tape Dental Injury: Teeth and Oropharynx as per pre-operative assessment

## 2014-03-26 ENCOUNTER — Encounter (HOSPITAL_BASED_OUTPATIENT_CLINIC_OR_DEPARTMENT_OTHER): Payer: Self-pay | Admitting: Surgery

## 2014-03-26 ENCOUNTER — Telehealth (INDEPENDENT_AMBULATORY_CARE_PROVIDER_SITE_OTHER): Payer: Self-pay | Admitting: General Surgery

## 2014-03-26 NOTE — Telephone Encounter (Signed)
Called patient with her path results and told her that there was no residual melanoma.  And I  told her to keep her apt with Dr Georgette Dover on 4-20, and she asked to keep her leg up when she is sitting and I told her yes and she asked if she could walk more around the house I told her yes but to be careful that the suture are at the lower part of her leg and they could pop open due to the pressure of her being up a lot on her leg

## 2014-04-03 ENCOUNTER — Encounter (INDEPENDENT_AMBULATORY_CARE_PROVIDER_SITE_OTHER): Payer: Self-pay

## 2014-04-08 ENCOUNTER — Ambulatory Visit (INDEPENDENT_AMBULATORY_CARE_PROVIDER_SITE_OTHER): Payer: Managed Care, Other (non HMO) | Admitting: Surgery

## 2014-04-08 ENCOUNTER — Encounter (INDEPENDENT_AMBULATORY_CARE_PROVIDER_SITE_OTHER): Payer: Self-pay | Admitting: Surgery

## 2014-04-08 VITALS — BP 134/78 | HR 72 | Temp 97.0°F | Resp 16 | Ht 65.0 in | Wt 145.0 lb

## 2014-04-08 DIAGNOSIS — C4371 Malignant melanoma of right lower limb, including hip: Secondary | ICD-10-CM

## 2014-04-08 DIAGNOSIS — C4370 Malignant melanoma of unspecified lower limb, including hip: Secondary | ICD-10-CM

## 2014-04-08 NOTE — Progress Notes (Signed)
S/p wide excision of right leg melanoma - no residual tumor cells; margins clear The wound is healing well with no sign of infection. The sutures are removed and replaced with Steri-Strips. She may shower over this area. She should continue with annual dermatology skin surveys but otherwise followup with [place] on a when necessary basis.  Imogene Burn. Georgette Dover, MD, [facility] Surgery  General/ Trauma Surgery  04/08/2014 8:50 AM

## 2014-04-08 NOTE — Patient Instructions (Signed)
Annual dermatology follow-up for skin survey

## 2014-07-22 ENCOUNTER — Telehealth: Payer: Self-pay | Admitting: Primary Care

## 2014-07-22 DIAGNOSIS — S4291XA Fracture of right shoulder girdle, part unspecified, initial encounter for closed fracture: Secondary | ICD-10-CM

## 2014-07-22 NOTE — Telephone Encounter (Signed)
Patient called, said she fell over the weekend, broke her R shoulder, she was seen by Urgent Care. Carrie Munoz already is scheduled for an ortho f/u visit tomorrow morning with Dr Carrie Munoz at [facility] but would like to know if Dr Carrie Munoz is a good specialist for her to be seen by. Please review referral. Thanks

## 2014-07-22 NOTE — Telephone Encounter (Signed)
I spoke with Carrie Munoz, she feels reassured by your input and would like to have a referral issued to Dr Shawna ClampElfar. She went to Urgent [facility] [address], they said they would send notes and imaging to our office. She also has images on a CD and will take the disk to her ortho appt tomorrow.

## 2014-07-22 NOTE — Telephone Encounter (Signed)
[employer] trained, specializes in upper extremity orthopedics.  Totally appropriate next step.

## 2014-07-23 ENCOUNTER — Encounter: Payer: Self-pay | Admitting: Orthopedic Surgery

## 2014-07-23 ENCOUNTER — Ambulatory Visit: Payer: Self-pay | Admitting: Orthopedic Surgery

## 2014-07-23 ENCOUNTER — Other Ambulatory Visit: Payer: Self-pay | Admitting: Gastroenterology

## 2014-07-23 VITALS — BP 125/76 | Ht 62.0 in | Wt 145.0 lb

## 2014-07-23 DIAGNOSIS — M25519 Pain in unspecified shoulder: Secondary | ICD-10-CM

## 2014-07-24 NOTE — Progress Notes (Signed)
This office note has been dictated.

## 2014-08-01 NOTE — H&P (Signed)
Carrie Munoz:   Munoz, Carrie  MR #:  [id number]   ACCOUNT #:  [id number] DOB:  1966-10-01   ATTENDING:  Cam HaiJohn Makel Mcmann, MD AGE:  48   DATE OF VISIT:  07/23/2014     Dr. Dan EuropeMaskiell and Dr. Mliss FritzBudd, thank you very much for referring your patient to the Shoulder Service for evaluation.  The complete note covering our examination and recommendations is found below.  Please feel free to call us at [phone] with any questions or concerns.       DATE SYMPTOMS STARTED OR INJURY OCCURRED:  07/20/14     PRIMARY COMPLAINT: Right shoulder trauma.               HISTORY OF PRESENT COMPLAINT:  Carrie MuscaDiana Munoz is a 48 year old female being seen today after a trauma when she fell while jogging.  She had a clavicle fracture.  She had no other injury, no loss of consciousness, and no head trauma as a result of this fall.  She is here today with a clavicle fracture and an A-C joint separation.  She presents today for consultation regarding definitive management of the problem.     MEDICAL/SOCIAL HISTORY:  Please refer to the [facility] New Patient History form for details on Past Medical History, Past Surgical History, Medications, Allergies, Social History, Family History, and Review of Systems. This questionnaire was reviewed with the patient and confirmed.     PHYSICAL EXAM:    [name] is a normal-appearing female with normal mood and appropriate affect; alert and oriented x3 and in no apparent distress.  Patient appears stated age.                 Constitution:               Healthy-appearing.   Gait:                             Normal-appearing.     Inspection:                                                         She has swelling and clear ecchymosis of her clavicle.   Tenderness:                                                      Tender directly over her clavicle.  Her bra strap is right over the fracture site.     Range of Motion:                                               I have not ranged her shoulder as it would  hurt her.     Cuff Strength:                                         Unable to be tested.  Summary of Exam:                                            Patient has signs and symptoms consistent with a right clavicle fracture.       IMAGING/X-RAYS:  The x-rays were personally reviewed today by me. They confirm a clavicle fracture of the lateral end, but her medial side and her lateral side are both still down.  She has an avulsion of the coracoclavicular ligament, but they are reasonably displaced today.       ASSESSMENT:  Right clavicle fracture.       TREATMENT PLAN:  I would like to bring her back in TWO TO THREE WEEKS.  I'M ORDERING REPEAT X-RAYS TO BE TAKEN OF HER SHOULDER PRIOR TO HER BEING SEEN AT THAT VISIT.  Then, we will probably start therapy.  I am looking towards  nonoperative management at this time.       ADDENDUM:  She did hit her head when she fell, but she didn't lose consciousness.             ______________________________  Cam Hai, MD    JE/MODL  DD:  07/23/2014 10:08:05  DT:  08/01/2014 03:41:35  Job #:  [id number]    cc: Cam Hai, MD   Ruby Cola, MD   [address]   [place], [place] [address]     Noralee Stain, MD   [address]   [place], [place] [address]

## 2014-08-13 ENCOUNTER — Ambulatory Visit: Payer: Self-pay | Admitting: Orthopedic Surgery

## 2014-08-13 ENCOUNTER — Encounter: Payer: Self-pay | Admitting: Orthopedic Surgery

## 2014-08-13 VITALS — BP 120/80 | Ht 62.0 in | Wt 145.0 lb

## 2014-08-13 DIAGNOSIS — M25519 Pain in unspecified shoulder: Secondary | ICD-10-CM

## 2014-08-13 DIAGNOSIS — M25511 Pain in right shoulder: Secondary | ICD-10-CM

## 2014-08-13 NOTE — Progress Notes (Signed)
This office note has been dictated.

## 2014-08-14 NOTE — Progress Notes (Signed)
PATIENTDONNICA, Carrie Munoz  MR #:  [id number]   ACCOUNT #:  [id number] DOB:  [birth year]   DICTATED BY:  Cam Hai, MD DATE OF VISIT:  08/13/2014     She is seen today now status post right clavicle fracture.  She is actually doing pretty well.     PHYSICAL EXAMINATION:  She has about 60 degrees of abduction.  She is only three weeks out.     X-RAYS today are personally reviewed by me. They confirm acceptable alignment of her distal clavicle.     ASSESSMENT/PLAN: Patient has a distal clavicle fracture.  She is going to start therapy.  She is going to follow up with Angelika Hofmeister, my PA,  in 6-8 WEEKS AND I'M ORDERING THAT SHE HAVE REPEAT X-RAYS TAKEN OF HER SHOULDER PRIOR TO HER BEING SEEN AT THAT VISIT.             ______________________________  Cam Hai, MD    JE/MODL  DD:  08/13/2014 16:32:05  DT:  08/14/2014 11:01:09  Job #:  [id number]    cc: Cam Hai, MD   Ruby Cola, MD   [address]   [place], [place] [address]

## 2014-08-20 ENCOUNTER — Telehealth: Payer: Self-pay | Admitting: Obstetrics and Gynecology

## 2014-08-20 NOTE — Telephone Encounter (Signed)
Thank you for processing this request.  I agree with switching to the name brand.

## 2014-08-20 NOTE — Telephone Encounter (Signed)
Patient is asking for an override to her insurance co. For her Tri-Sprintec. Patient is unable to get refills until our office calls her insurance. Patient was using a mail order pharmacy, does not like and wishes to return to [facility].

## 2014-08-20 NOTE — Telephone Encounter (Signed)
Patient is on Brand [employer] only. Patient has breakthrough bleeding on generic. Patient needs authorization to  insurance company that she needs Brand only [employer] and that it is okay for her to pick up at local pharmacy and not use mail order. Patient cannot use mail order because mail order does not have generic available.  [facility]ed [facility] and rx is processed without issue.  [employer] and requested prior authorization form. Competed and sent to Dr. Quincy Simmonds for review.

## 2014-08-21 ENCOUNTER — Encounter: Payer: Self-pay | Admitting: Gastroenterology

## 2014-08-21 NOTE — Telephone Encounter (Signed)
Spoke with patient to update. She is waiting for pharmacy to call her with new rx.  She is on her cycle this week and states that this week and for the last two cycles she has been having increased pain, heavier cycles and headaches. She would like to discuss peri menopausal symptoms with Dr. Quincy Simmonds and to ensure that staying on ocp at this time in her life is what will be best. Reported heavier cycles at last annual exam.  Patient taking brand tri sprintec with no missed pills.  Declines dizziness or weakness but does feel fatigued.  I advised patient to keep calendar of menses, call back if bleeding continues or is soaking pads or if any abdominal pain or cramping. She verbalizes understanding of symptoms of bleeding emergencies and when to call back to office or go to urgent care or emergency room as needed. Patient is agreeable and verbalizes understanding of plan and will call back if condition changes or worsens. Patient is scheduled for office visit with Dr. Quincy Simmonds for 08/23/14 at 1330.   Routing to provider for final review. Patient agreeable to disposition. Will close encounter

## 2014-08-23 ENCOUNTER — Encounter: Payer: Self-pay | Admitting: Obstetrics and Gynecology

## 2014-08-23 ENCOUNTER — Ambulatory Visit (INDEPENDENT_AMBULATORY_CARE_PROVIDER_SITE_OTHER): Payer: Managed Care, Other (non HMO) | Admitting: Obstetrics and Gynecology

## 2014-08-23 VITALS — BP 126/86 | HR 96 | Resp 18 | Ht 65.0 in | Wt 146.0 lb

## 2014-08-23 DIAGNOSIS — N920 Excessive and frequent menstruation with regular cycle: Secondary | ICD-10-CM

## 2014-08-23 DIAGNOSIS — N946 Dysmenorrhea, unspecified: Secondary | ICD-10-CM

## 2014-08-23 DIAGNOSIS — N921 Excessive and frequent menstruation with irregular cycle: Secondary | ICD-10-CM

## 2014-08-23 LAB — CBC
HCT: 33.6 % — ABNORMAL LOW (ref 36.0–46.0)
Hemoglobin: 11.2 g/dL — ABNORMAL LOW (ref 12.0–15.0)
MCH: 26 pg (ref 26.0–34.0)
MCHC: 33.3 g/dL (ref 30.0–36.0)
MCV: 78.1 fL (ref 78.0–100.0)
PLATELETS: 433 10*3/uL — AB (ref 150–400)
RBC: 4.3 MIL/uL (ref 3.87–5.11)
RDW: 14.5 % (ref 11.5–15.5)
WBC: 8.4 10*3/uL (ref 4.0–10.5)

## 2014-08-23 NOTE — Progress Notes (Signed)
GYNECOLOGY  VISIT   HPI: 48 y.o.   Married  Caucasian  female   G2P2003 with Patient's last menstrual period was 08/19/2014.   here for  Heavy painful irregular cycles.  "I miss Dr. Joan Flores.  She knows me well."  Patient took generic OCPs last month and had early and prolonged bleeding.  This month back on name brand pills and had heavy and painful bleeding.  No skipped pills.  Has menstrual headache.  Takes Excedrin headache which treats headache.  Declines estrogen.   Stress at home. Step son died.  Mother died of bone cancer. Grandson died.  Declined UPT.   GYNECOLOGIC HISTORY: Patient's last menstrual period was 08/19/2014. Contraception:  OCP - TriSprintec  Menopausal hormone therapy: N/A        OB History   Grav Para Term Preterm Abortions TAB SAB Ect Mult Living   2 2 2       3      Obstetric Comments   1 adopted son in 3/94         Patient Active Problem List   Diagnosis Date Noted  . Malignant melanoma of right lower leg 03/11/2014    Past Medical History  Diagnosis Date  . Wears glasses     Past Surgical History  Procedure Laterality Date  . Gynecologic cryosurgery      d/t heavy vaginal discharge  . Tonsillectomy and adenoidectomy    . Wisdom tooth extraction    . Mass excision Right 03/21/2014    Procedure: WIDE EXCISION MELANOMA RIGHT LEG;  Surgeon: Imogene Burn. Georgette Dover, MD;  Location: [place];  Service: General;  Laterality: Right;    Current Outpatient Prescriptions  Medication Sig Dispense Refill  . Norgestimate-Ethinyl Estradiol Triphasic (TRI-SPRINTEC) 0.18/0.215/0.25 MG-35 MCG tablet Take 1 tablet by mouth daily. Brand name only per patient request. 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  1 Package  11   No current facility-administered medications for this visit.     ALLERGIES: Review of patient's allergies indicates no known allergies.  Family History  Problem Relation Age of Onset  . Hypertension Father   . Heart attack Father      bypass surgery  . Heart disease Father     History   Social History  . Marital Status: Married    Spouse Name: N/A    Number of Children: N/A  . Years of Education: N/A   Occupational History  . Not on file.   Social History Main Topics  . Smoking status: Never Smoker   . Smokeless tobacco: Never Used  . Alcohol Use: No  . Drug Use: No  . Sexual Activity: Yes    Partners: Male    Birth Control/ Protection: OCP, Pill     Comment: Trisprintec   Other Topics Concern  . Not on file   Social History Narrative  . No narrative on file    ROS:  Pertinent items are noted in HPI.  PHYSICAL EXAMINATION:    BP 126/86  Pulse 96  Resp 18  Ht 5\' 5"  (1.651 m)  Wt 146 lb (66.225 kg)  BMI 24.30 kg/m2  LMP 08/19/2014     General appearance: alert, cooperative and appears stated age.  Tearful.    Patient requests no chaperone for examination.   Pelvic: External genitalia:  no lesions              Urethra:  normal appearing urethra with no masses, tenderness or lesions  Bartholins and Skenes: normal                 Vagina: normal appearing vagina with normal color and discharge, no lesions              Cervix: normal appearance                   Bimanual Exam:  Uterus:  uterus is normal size, shape, consistency and nontender                                      Adnexa: normal adnexa in size, nontender and no masses                                        ASSESSMENT  Bereavement. Dysmenorrhea. Menorrhagia. Recent irregular menses, resolved.  PLAN  Check TSH, CBC, CMP. Return for pelvic ultrasound.  Continue OCPs.  An After Visit Summary was printed and given to the patient.  __25____ minutes face to face time of which over 50% was spent in counseling.

## 2014-08-24 LAB — COMPREHENSIVE METABOLIC PANEL
ALBUMIN: 4.3 g/dL (ref 3.5–5.2)
ALK PHOS: 50 U/L (ref 39–117)
ALT: 12 U/L (ref 0–35)
AST: 16 U/L (ref 0–37)
BILIRUBIN TOTAL: 0.2 mg/dL (ref 0.2–1.2)
BUN: 11 mg/dL (ref 6–23)
CO2: 27 mEq/L (ref 19–32)
Calcium: 9.5 mg/dL (ref 8.4–10.5)
Chloride: 103 mEq/L (ref 96–112)
Creat: 0.77 mg/dL (ref 0.50–1.10)
Glucose, Bld: 92 mg/dL (ref 70–99)
POTASSIUM: 4.7 meq/L (ref 3.5–5.3)
Sodium: 139 mEq/L (ref 135–145)
TOTAL PROTEIN: 6.8 g/dL (ref 6.0–8.3)

## 2014-08-24 LAB — TSH: TSH: 2.708 u[IU]/mL (ref 0.350–4.500)

## 2014-08-29 ENCOUNTER — Telehealth: Payer: Self-pay | Admitting: Obstetrics and Gynecology

## 2014-08-29 NOTE — Telephone Encounter (Signed)
Left message for patient to call back. Need to go over benefits and schedule PUS °

## 2014-08-30 NOTE — Telephone Encounter (Signed)
Pt returning call

## 2014-09-02 NOTE — Telephone Encounter (Signed)
Spoke with patient. Advised that per benefit quote received, she will be responsible for a $45 copay when she comes in for PUS. Patient agreeable. Scheduled PUS

## 2014-09-12 ENCOUNTER — Ambulatory Visit (INDEPENDENT_AMBULATORY_CARE_PROVIDER_SITE_OTHER): Payer: Managed Care, Other (non HMO) | Admitting: Obstetrics and Gynecology

## 2014-09-12 ENCOUNTER — Ambulatory Visit (INDEPENDENT_AMBULATORY_CARE_PROVIDER_SITE_OTHER): Payer: Managed Care, Other (non HMO)

## 2014-09-12 ENCOUNTER — Other Ambulatory Visit: Payer: Managed Care, Other (non HMO)

## 2014-09-12 ENCOUNTER — Telehealth: Payer: Self-pay | Admitting: *Deleted

## 2014-09-12 ENCOUNTER — Encounter: Payer: Self-pay | Admitting: Obstetrics and Gynecology

## 2014-09-12 VITALS — BP 120/84 | HR 80 | Ht 65.0 in | Wt 147.0 lb

## 2014-09-12 DIAGNOSIS — N921 Excessive and frequent menstruation with irregular cycle: Secondary | ICD-10-CM

## 2014-09-12 DIAGNOSIS — D259 Leiomyoma of uterus, unspecified: Secondary | ICD-10-CM

## 2014-09-12 DIAGNOSIS — N946 Dysmenorrhea, unspecified: Secondary | ICD-10-CM

## 2014-09-12 DIAGNOSIS — N920 Excessive and frequent menstruation with regular cycle: Secondary | ICD-10-CM

## 2014-09-12 NOTE — Progress Notes (Signed)
Subjective  48 y.o. Married Caucasian female  G2P2003 with Patient's last menstrual period was 08/19/2014.  here for ultrasound for heavy painful irregular cycles.   Patient took generic OCPs last month and had early and prolonged bleeding.  This month back on name brand pills and had heavy and painful bleeding.  No skipped pills.  Has menstrual headache. Takes Excedrin headache which treats headache. Declines estrogen.   States a history of fibroids.   History of 2 vaginal deliveries.   "Can we just remove the uterus?"  No family history of reproductive cancers.   Objective  Pelvic ultrasound - see images and report below - reviewed by me with patient.  Uterus - 8 fibroids, largest is 2.75 cm, distorted endometrium, cannot rule out submucous fibroid, EMS 6.60 mm, normal ovaries, no free fluid.     Assessment  Uterine fibroids - symptomatic.  On OCPs.  Plan  Discussion of fibroids. Discussed therapies for bleeding and fibroids - medical options with hormonal contraception, uterine artery embolization, myomectomy, hysterectomy.  Patient prefers to pursue hysterectomy. I am recommending a robotic total laparoscopic hysterectomy with bilateral salpingectomy and cystoscopy.  We have reviewed the differences between robotics and regular laparoscopy and an abdominal hysterectomy. Patient will return for an endometrial biopsy.  She will complete her mammogram and call [facility] for this.  Handout on fibroids and DVD about DaVinci hysterectomy.   25 minutes face to face time of which over 50% was spent in counseling.   After visit summary to patient.

## 2014-09-12 NOTE — Telephone Encounter (Signed)
Call to patient to discuss surgical date preferences. First available dateis 11-05-14. Patient thinks this will work for her. Will proceed with precert and patietn will check schedule. She will let me know if wants to proceed with scheduling.

## 2014-09-12 NOTE — Telephone Encounter (Signed)
Message copied by Jaymes Graff on Thu Sep 12, 2014  3:32 PM ------      Message from: [place], [place]: Thu Sep 12, 2014  2:04 PM      Regarding: precert EMB and surgery and schedule surgery        Please precert EMB.      Patient is wanting to proceed with a robotic total laparoscopic hysterectomy, bilateral salpingectomy, and cystoscopy.       Diagnosis is symptomatic fibroids.       OR time - 3 hours.            Her husband is having knee surgery in December.            Thanks to both! ------

## 2014-09-13 ENCOUNTER — Encounter: Payer: Self-pay | Admitting: Gastroenterology

## 2014-09-13 NOTE — Telephone Encounter (Signed)
Patient returned call. Advised patient that per benefit quote received, she will be responsible for a $45 copay when she comes in for EMB. Patient agreeable. Advised that per benefit quote received, she will be responsible for $1243.32 for the surgeon portion of her surgery. Advised that her plan has a $1000 cal year deductible and then the plan pays 75/25.  Advised that payment is due in full at least 2 weeks prior to the scheduled surgery date. Patient agreeable. Advised that she will be hearing from Cyril regarding scheduling.

## 2014-09-13 NOTE — Telephone Encounter (Signed)
Left message for patient to call back. Need to go over benefits and schedule EMB. Need to go over surgery benefits

## 2014-09-13 NOTE — Telephone Encounter (Signed)
Call back to patient to clarify decision States she is still reviewing cost of provider and hospital with her husband. She will call me back when she has made decision. She is NOT yet ready to schedule.  Routing to provider for final review. Patient agreeable to disposition. Will close encounter

## 2014-09-16 ENCOUNTER — Telehealth: Payer: Self-pay | Admitting: Obstetrics and Gynecology

## 2014-09-16 NOTE — Telephone Encounter (Signed)
Pt is calling to schedule surgery. Previous note with sally

## 2014-09-18 ENCOUNTER — Telehealth: Payer: Self-pay | Admitting: Primary Care

## 2014-09-18 DIAGNOSIS — Z13228 Encounter for screening for other metabolic disorders: Secondary | ICD-10-CM

## 2014-09-18 DIAGNOSIS — Z1321 Encounter for screening for nutritional disorder: Secondary | ICD-10-CM

## 2014-09-18 DIAGNOSIS — Z130 Encounter for screening for diseases of the blood and blood-forming organs and certain disorders involving the immune mechanism: Secondary | ICD-10-CM

## 2014-09-18 DIAGNOSIS — Z1211 Encounter for screening for malignant neoplasm of colon: Secondary | ICD-10-CM

## 2014-09-18 DIAGNOSIS — Z1231 Encounter for screening mammogram for malignant neoplasm of breast: Secondary | ICD-10-CM

## 2014-09-18 DIAGNOSIS — Z1329 Encounter for screening for other suspected endocrine disorder: Secondary | ICD-10-CM

## 2014-09-18 NOTE — Telephone Encounter (Signed)
In reviewing her chart, I note a 48 yo woman with family history of hypertension, diabetes and thyroid disease.  Appropriate health maintenance would include:    Breast screening  Colon screening  Blood pressure measurement  Chem14  HbA1c  TSH  Fasting lipids    If any of this was already done, then copies of results should be sought.  Thanks.

## 2014-09-18 NOTE — Telephone Encounter (Signed)
Patient called to schedule flu shot and other vaccinations, wanted to know if she is due for some updated lab work. Please advise. Thanks

## 2014-09-19 ENCOUNTER — Ambulatory Visit: Payer: Self-pay

## 2014-09-19 NOTE — Telephone Encounter (Signed)
Spoke with patient. Advised that per benefit quote received, she will be responsible for $45 copay when she comes in for EMB. Patient agreeable. Scheduled EMB. Mailed the In-Office procedure form that includes appointment date and time, patient copay, and cancellation policy.

## 2014-09-25 ENCOUNTER — Ambulatory Visit: Payer: Self-pay | Admitting: Orthopedic Surgery

## 2014-09-30 ENCOUNTER — Telehealth: Payer: Self-pay | Admitting: *Deleted

## 2014-09-30 ENCOUNTER — Telehealth: Payer: Self-pay | Admitting: Obstetrics and Gynecology

## 2014-09-30 NOTE — Telephone Encounter (Signed)
Instructions given as above. Printed surgical instruction sheet mailed. Encounter closed.

## 2014-09-30 NOTE — Telephone Encounter (Signed)
MAILED SURGERY PAYMENT LETTER TO PATIENT:: September 30, 2014   Dear Gemma Payor,  Your surgery is scheduled for November 05, 2014.  Upon requesting authorization for your surgery, your insurance company has informed us that they will cover 75% of the charges after a $1000 deductible, and you will be responsible to pay approximately $1243.32. It is our office policy that this amount is paid in full two weeks prior to your surgery. Your payment is due November 03, 2015_.  If there is a balance due after your insurance company pays their portion, we will send you a bill.  If there is a refund due to you, we will send you a check within one month.  Payment may be made by cash, check, Visa, MasterCard or American Express.  If payment is not made two weeks prior to your surgery, we will have to reschedule your surgery.  The above fee includes only our fee for the surgery and does not include charges you may have from the facility, anesthesia or pathology.  If you have any questions, please call us at [phone].

## 2014-09-30 NOTE — Telephone Encounter (Signed)
I recommend a magnesium citrate bowel prep the morning prior to her procedure and do only clear liquids the day prior to her procedure.   I agree for her to continue on her birth control pills until surgery.

## 2014-09-30 NOTE — Telephone Encounter (Signed)
Surgery date of 11-05-14 at 52 at [facility] confirmed with patient. Surgical instruction sheet reviewed with patient and printed copy mailed. All surgical appointments scheduled. Endometrial biopsy, surgery consult, 1 week post op, and 6 week post op.  She is also asking about her OCP. Instrcuted her to continue these until day of surgery. Instructed to complete Magnesium Citrate bowel prep, is this correct?

## 2014-10-07 ENCOUNTER — Encounter: Payer: Self-pay | Admitting: Obstetrics and Gynecology

## 2014-10-07 ENCOUNTER — Ambulatory Visit (INDEPENDENT_AMBULATORY_CARE_PROVIDER_SITE_OTHER): Payer: Managed Care, Other (non HMO) | Admitting: Obstetrics and Gynecology

## 2014-10-07 VITALS — BP 150/80 | HR 84 | Temp 98.2°F | Resp 18 | Wt 148.0 lb

## 2014-10-07 DIAGNOSIS — N921 Excessive and frequent menstruation with irregular cycle: Secondary | ICD-10-CM

## 2014-10-07 DIAGNOSIS — D259 Leiomyoma of uterus, unspecified: Secondary | ICD-10-CM

## 2014-10-07 NOTE — Patient Instructions (Signed)
Endometrial Biopsy, Care After Refer to this sheet in the next few weeks. These instructions provide you with information on caring for yourself after your procedure. Your health care provider may also give you more specific instructions. Your treatment has been planned according to current medical practices, but problems sometimes occur. Call your health care provider if you have any problems or questions after your procedure. WHAT TO EXPECT AFTER THE PROCEDURE After your procedure, it is typical to have the following:  You may have mild cramping and a small amount of vaginal bleeding for a few days after the procedure. This is normal. HOME CARE INSTRUCTIONS  Only take over-the-counter or prescription medicine as directed by your health care provider.  Do not douche, use tampons, or have sexual intercourse until your health care provider approves.  Follow your health care provider's instructions regarding any activity restrictions, such as strenuous exercise or heavy lifting. SEEK MEDICAL CARE IF:  You have heavy bleeding or bleeding longer than 2 days after the procedure.  You have bad smelling drainage from your vagina.  You have a fever and chills.  Youhave severe lower stomach (abdominal) pain. SEEK IMMEDIATE MEDICAL CARE IF:  You have severe cramps in your stomach or back.  You pass large blood clots.  Your bleeding increases.  You become weak or lightheaded, or you pass out. Document Released: 09/26/2013 Document Reviewed: 09/26/2013 ExitCare Patient Information 2015 [employer]. This information is not intended to replace advice given to you by your health care provider. Make sure you discuss any questions you have with your health care provider.  

## 2014-10-07 NOTE — Progress Notes (Signed)
GYNECOLOGY  VISIT   HPI: 48 y.o.   Married  Caucasian  female   G2P2003 with No LMP recorded. Patient is not currently having periods (Reason: Oral contraceptives).   here for   EMB. Patient having upcoming hysterectomy for abnormal uterine bleeding and fibroids.   Prefers to keep her ovaries.  On OCPs and blood pressure is elevated.   GYNECOLOGIC HISTORY: No LMP recorded. Patient is not currently having periods (Reason: Oral contraceptives). Contraception: Sprintec   Menopausal hormone therapy: None        OB History   Grav Para Term Preterm Abortions TAB SAB Ect Mult Living   2 2 2       3      Obstetric Comments   1 adopted son in 3/94         Patient Active Problem List   Diagnosis Date Noted  . Malignant melanoma of right lower leg 03/11/2014    Past Medical History  Diagnosis Date  . Wears glasses     Past Surgical History  Procedure Laterality Date  . Gynecologic cryosurgery      d/t heavy vaginal discharge  . Tonsillectomy and adenoidectomy    . Wisdom tooth extraction    . Mass excision Right 03/21/2014    Procedure: WIDE EXCISION MELANOMA RIGHT LEG;  Surgeon: Imogene Burn. Georgette Dover, MD;  Location: [place];  Service: General;  Laterality: Right;    Current Outpatient Prescriptions  Medication Sig Dispense Refill  . Norgestimate-Ethinyl Estradiol Triphasic (TRI-SPRINTEC) 0.18/0.215/0.25 MG-35 MCG tablet Take 1 tablet by mouth daily. Brand name only per patient request. 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  1 Package  11   No current facility-administered medications for this visit.     ALLERGIES: Review of patient's allergies indicates no known allergies.  Family History  Problem Relation Age of Onset  . Hypertension Father   . Heart attack Father     bypass surgery  . Heart disease Father     History   Social History  . Marital Status: Married    Spouse Name: N/A    Number of Children: N/A  . Years of Education: N/A   Occupational  History  . Not on file.   Social History Main Topics  . Smoking status: Never Smoker   . Smokeless tobacco: Never Used  . Alcohol Use: No  . Drug Use: No  . Sexual Activity: Yes    Partners: Male    Birth Control/ Protection: OCP, Pill     Comment: Trisprintec   Other Topics Concern  . Not on file   Social History Narrative  . No narrative on file    ROS:  Pertinent items are noted in HPI.  PHYSICAL EXAMINATION:    BP 150/80  Pulse 84  Temp(Src) 98.2 F (36.8 C) (Oral)  Resp 18  Wt 148 lb (67.132 kg)     General appearance: alert, cooperative and appears stated age   Pelvic: External genitalia:  no lesions              Urethra:  normal appearing urethra with no masses, tenderness or lesions              Bartholins and Skenes: normal                 Vagina: normal appearing vagina with normal color and discharge, no lesions              Cervix: prominent anterior cervical  lip.                    Bimanual Exam:  Uterus:  uterus is normal size, shape, consistency and nontender                                      Adnexa: normal adnexa in size, nontender and no masses     Endometrial biopsy.  Consent for procedure.  Speculum placed in vagina.  Paracervical block with 10 cc 1% lidocaine - lot number [id number], Expiration 05/21/15. Tenaculum to anterior cervical lip. Pipelle passed to 8 cm.  Dark brown clot removed several times.   Then Pipelle passed two more times and tissue obtained. All specimen sent to pathology together.  No complications.  Minimal EBL.                                  ASSESSMENT  Menometrorrhagia.  Fibroid uterus.  Failed oral contraception therapy.  Elevated blood pressure.  PLAN  Follow up biopsy results.  Instructions and precautions given.  Stop oral contraceptives.  Return for preop visit for robotic hysterectomy with removal of bilateral tubes.  Possible bilateral oophorectomy if has pathology such as endometriosis.  Understands  that removal of ovaries leads to menopause.  An After Visit Summary was printed and given to the patient.  __15____ minutes face to face time of which over 50% was spent in counseling.

## 2014-10-10 LAB — IPS OTHER TISSUE BIOPSY

## 2014-10-11 ENCOUNTER — Telehealth: Payer: Self-pay

## 2014-10-11 MED ORDER — NORETHINDRONE 0.35 MG PO TABS: 1.0000 | ORAL_TABLET | Freq: Every day | ORAL | Status: DC

## 2014-10-11 NOTE — Telephone Encounter (Signed)
Spoke with patient. Advised patient of results as seen below from [place]. Patient is agreeable and verbalizes understanding. Patient would like to start on Micronor at this time until she has her hysterectomy. Micronor #1 0RF sent to [facility] on file for patient. Patient is aware and agreeable.  Routing to provider for final review. Patient agreeable to disposition. Will close encounter

## 2014-10-11 NOTE — Telephone Encounter (Signed)
Pt was scheduled with Dr Quincy Simmonds for 1 week post op for 11/11/14. Dr Quincy Simmonds will not be in the office that day. Per sally move pt to 11/18/14 at 1:00 pm. Pt called and agreeable to come in 11/18/14 at 1:00 pm for first post op visit instead of 11/11/14. Pt are that if she has any problems she can call and get an appointment with another Dr while Dr Quincy Simmonds is out of town.

## 2014-10-11 NOTE — Telephone Encounter (Signed)
Message copied by Jasmine Awe on Fri Oct 11, 2014 12:04 PM ------      Message from: [place], [place]: Thu Oct 10, 2014 12:50 PM       Please report results of EMB to patient.       She has simple endometrial hyperplasia.  This is a condition that causes thickening of the lining of the uterus and can cause abnormal uterine bleeding!  (Patient also has fibroids.)      This is not cancer but if untreated over a long time is a risk factor for cancer.      Treatment is with birth control pills, progesterone hormone, or hysterectomy.       She is scheduled for hysterectomy so this is a great plan.       I asked patient to stop her combined oral contraceptive pills at her EMB visit as her blood pressure was elevated.       She can switch to Micronor progesterone only birth control pills (taken every day) as a treatment until hysterectomy is completed. ------

## 2014-10-21 ENCOUNTER — Encounter: Payer: Self-pay | Admitting: Obstetrics and Gynecology

## 2014-10-23 ENCOUNTER — Other Ambulatory Visit: Payer: Self-pay | Admitting: Obstetrics and Gynecology

## 2014-10-23 ENCOUNTER — Ambulatory Visit (INDEPENDENT_AMBULATORY_CARE_PROVIDER_SITE_OTHER): Payer: Managed Care, Other (non HMO) | Admitting: Obstetrics and Gynecology

## 2014-10-23 ENCOUNTER — Encounter: Payer: Self-pay | Admitting: Obstetrics and Gynecology

## 2014-10-23 VITALS — BP 130/78 | HR 84 | Ht 65.0 in | Wt 146.2 lb

## 2014-10-23 DIAGNOSIS — D219 Benign neoplasm of connective and other soft tissue, unspecified: Secondary | ICD-10-CM | POA: Insufficient documentation

## 2014-10-23 DIAGNOSIS — Z1231 Encounter for screening mammogram for malignant neoplasm of breast: Secondary | ICD-10-CM

## 2014-10-23 DIAGNOSIS — N8501 Benign endometrial hyperplasia: Secondary | ICD-10-CM

## 2014-10-23 DIAGNOSIS — D259 Leiomyoma of uterus, unspecified: Secondary | ICD-10-CM

## 2014-10-23 NOTE — Patient Instructions (Signed)
You are excused from the bowel prep! Do not eat or drink after midnight the night before surgery.

## 2014-10-23 NOTE — Progress Notes (Signed)
Patient ID: Natasha Villegas, female   DOB: 25-Jun-1966, 48 y.o.   MRN: [id number] GYNECOLOGY  VISIT   HPI: 48 y.o.   Married  Caucasian  female   G2P2003 with Patient's last menstrual period was 10/14/2014 (exact date).   here to discuss surgery.   Patient having heavy and prolonged menses despite oral contraceptive therapy.  Combined OCPs stopped due to elevated blood pressure.  Will continue to take Micronor until surgery.   Ultrasound showed multiple fibroids and EMB showed simple focal endometrial hyperplasia without atypia.  Declines future childbearing.   OK to not take bowel prep.  Wants to go home the same day.   GYNECOLOGIC HISTORY: Patient's last menstrual period was 10/14/2014 (exact date). Contraception: OCPs   Menopausal hormone therapy: n/a Mammogram - 2014 - WNL.  Will schedule her appointment for her mammogram for this year.   OB History    Gravida Para Term Preterm AB TAB SAB Ectopic Multiple Living   2 2 2       3       Obstetric Comments   1 adopted son in 3/94         Patient Active Problem List   Diagnosis Date Noted  . Malignant melanoma of right lower leg 03/11/2014    Past Medical History  Diagnosis Date  . Wears glasses     Past Surgical History  Procedure Laterality Date  . Gynecologic cryosurgery      d/t heavy vaginal discharge  . Tonsillectomy and adenoidectomy    . Wisdom tooth extraction    . Mass excision Right 03/21/2014    Procedure: WIDE EXCISION MELANOMA RIGHT LEG;  Surgeon: Imogene Burn. Georgette Dover, MD;  Location: [place];  Service: General;  Laterality: Right;    Current Outpatient Prescriptions  Medication Sig Dispense Refill  . norethindrone (MICRONOR,CAMILA,ERRIN) 0.35 MG tablet Take 1 tablet (0.35 mg total) by mouth daily. 1 Package 0   No current facility-administered medications for this visit.     ALLERGIES: Review of patient's allergies indicates no known allergies.  Family History  Problem Relation Age of  Onset  . Hypertension Father   . Heart attack Father     bypass surgery  . Heart disease Father     History   Social History  . Marital Status: Married    Spouse Name: N/A    Number of Children: N/A  . Years of Education: N/A   Occupational History  . Not on file.   Social History Main Topics  . Smoking status: Never Smoker   . Smokeless tobacco: Never Used  . Alcohol Use: No  . Drug Use: No  . Sexual Activity:    Partners: Male    Birth Control/ Protection: OCP, Pill     Comment: Trisprintec   Other Topics Concern  . Not on file   Social History Narrative    ROS:  Pertinent items are noted in HPI.  PHYSICAL EXAMINATION:    BP 130/78 mmHg  Pulse 84  Ht 5\' 5"  (1.651 m)  Wt 146 lb 3.2 oz (66.316 kg)  BMI 24.33 kg/m2  LMP 10/14/2014 (Exact Date)     General appearance: alert, cooperative and appears stated age Lungs: clear to auscultation bilaterally Heart: regular rate and rhythm Abdomen: soft, non-tender; no masses,  no organomegaly No abnormal inguinal nodes palpated  Pelvic    Deferred.  ASSESSMENT  Symptomatic fibroids.  Simple endometrial hyperplasia without atypia.  On Micronor.  PLAN  Proceed with robotic total laparoscopic hysterectomy with bilateral salpingectomy and cystoscopy, possible bilateral oophorectomy. Benefits, risks, and alternatives discussed with the patient who wishes to proceed.  Risks include but are not limited to bleeding, infection, damage to surrounding organs, reaction to anesthesia, DVT, PE, death, vaginal cuff dehiscence, hernias, neuropathy, need for reoperation, possible laparotomy, menopausal symptoms if ovaries are removed unless estrogen therapy is initiated.    An After Visit Summary was printed and given to the patient.  __25____ minutes face to face time of which over 50% was spent in counseling.

## 2014-10-23 NOTE — Patient Instructions (Addendum)
   Your procedure is scheduled on:  Tuesday, Nov 17  Enter through the [employer] [facility] at:  6 AM Pick up the phone at the desk and dial [phone] and inform us of your arrival.  Please call this number if you have any problems the morning of surgery: [phone]  Remember: Do not eat or drink after midnight: Monday Take these medicines the morning of surgery with a SIP OF WATER:  None  Do not wear jewelry, make-up, or FINGER nail polish No metal in your hair or on your body. Do not wear lotions, powders, perfumes.  You may wear deodorant.  Do not bring valuables to the hospital. Contacts, dentures or bridgework may not be worn into surgery.  Leave suitcase in the car. After Surgery it may be brought to your room. For patients being admitted to the hospital, checkout time is 11:00am the day of discharge.    Patients discharged on the day of surgery will not be allowed to drive home.  Home with husband Natasha Villegas cell [phone]

## 2014-10-24 ENCOUNTER — Encounter (HOSPITAL_COMMUNITY): Payer: Self-pay | Admitting: Pharmacy Technician

## 2014-10-24 ENCOUNTER — Telehealth: Payer: Self-pay

## 2014-10-24 ENCOUNTER — Encounter (HOSPITAL_COMMUNITY): Payer: Self-pay

## 2014-10-24 ENCOUNTER — Encounter (HOSPITAL_COMMUNITY)
Admission: RE | Admit: 2014-10-24 | Discharge: 2014-10-24 | Disposition: A | Discharge status: Home / Self Care | Payer: Managed Care, Other (non HMO) | Source: Ambulatory Visit | Attending: Obstetrics and Gynecology | Admitting: Obstetrics and Gynecology

## 2014-10-24 DIAGNOSIS — Z01812 Encounter for preprocedural laboratory examination: Secondary | ICD-10-CM | POA: Insufficient documentation

## 2014-10-24 LAB — BASIC METABOLIC PANEL
Anion gap: 10 (ref 5–15)
BUN: 11 mg/dL (ref 6–23)
CHLORIDE: 102 meq/L (ref 96–112)
CO2: 26 meq/L (ref 19–32)
CREATININE: 0.72 mg/dL (ref 0.50–1.10)
Calcium: 8.6 mg/dL (ref 8.4–10.5)
GFR calc Af Amer: >90 mL/min (ref >90)
GFR calc non Af Amer: >90 mL/min (ref >90)
Glucose, Bld: 107 mg/dL — ABNORMAL HIGH (ref 70–99)
Potassium: 4 mEq/L (ref 3.7–5.3)
Sodium: 138 mEq/L (ref 137–147)

## 2014-10-24 LAB — CBC
HEMATOCRIT: 32.8 % — AB (ref 36.0–46.0)
HEMOGLOBIN: 10.6 g/dL — AB (ref 12.0–15.0)
MCH: 25.2 pg — AB (ref 26.0–34.0)
MCHC: 32.3 g/dL (ref 30.0–36.0)
MCV: 77.9 fL — ABNORMAL LOW (ref 78.0–100.0)
Platelets: 394 10*3/uL (ref 150–400)
RBC: 4.21 MIL/uL (ref 3.87–5.11)
RDW: 14.9 % (ref 11.5–15.5)
WBC: 9.2 10*3/uL (ref 4.0–10.5)

## 2014-10-24 NOTE — Telephone Encounter (Signed)
-----   Message from [place], MD sent at 10/24/2014  3:16 PM EST ----- Please contact patient with her blood work.   Patient having preop at hospital today. Just had her blood work done.  Has anemia and will need to start Fe SO4 325 mg po bid.  This will help to improve her blood counts for surgery! She may need a stool sosftener - Colace 100 mg po q day due to possible constipation.

## 2014-10-24 NOTE — Pre-Procedure Instructions (Signed)
Discussed patient's blood pressure readings at today's PAT visit with Dr Lyndle Herrlich.  BP 174/101 at beginning of PAT visit and 172/100 at end of PAT visit.  Dr Marylyn Ishihara will send note to Dr Quincy Simmonds concerning BP readings.  EKG was NSR.  No orders given.

## 2014-10-24 NOTE — Telephone Encounter (Signed)
Left message to call Kaitlyn at [phone]. 

## 2014-10-25 ENCOUNTER — Telehealth: Payer: Self-pay

## 2014-10-25 NOTE — Telephone Encounter (Signed)
-----   Message from [place], MD sent at 10/25/2014  6:07 AM EST ----- Please contact patient and have her see her PCP ASAP for treatment of hypertension.   I do not want her to have to reschedule her surgery unnecessarily due to a delay in treatment of her blood pressure.   Thank you for your help!  Josefa Half, MD ----- Message -----    From: Lyndle Herrlich, MD    Sent: 10/24/2014   4:00 PM      To: Assunta Gambles, MD, Rudean Curt, MD, #  [place],   I received a call about this lady for her scheduled procedure on 11/17.  Her diastolic BP's were in the low 100's. She takes no antihypertensives.  These DBP's alone would not cancel her case, but if they were somewhat higher the DOS, we might be forced to call it differently.   Consider adding BP RX, or sending her to her local MD. Leodis Rains

## 2014-10-25 NOTE — Telephone Encounter (Signed)
Call to patient. Notified of lab results and Dr Elza Rafter recommendation to add FeSO4 325 mg BID and Colace QD.  Instructions on taking with Vit C to maximize absorption and avoid taking with calcium.   Advised of Dr Elza Rafter instruction regarding management of BP by PCP ASAP in effort to prevent cancellation of surgery. Patient states she is surprised by elevated blood pressure. She feels fine and was not nervous yesterday while at PAT appointment. She is on her way out of town and will not be returning until late on Monday. Discussed that hypertension often has no symptoms but that is not indicative of need for treatment. Needs PCP to determine this. Advised she call PCP and schedule OV for as soon as she returns to town for management of blood pressure. Advised we will need update from PCP before her surgery Again stressed that if BP remains elevated, could result in cancellation or delay of surgery.   Patient will call PCP and will call me with update after she is seen.  Routing to provider for final review. Patient agreeable to disposition. Will close encounter

## 2014-10-25 NOTE — Telephone Encounter (Signed)
Telephone encounter opened in error.

## 2014-10-29 ENCOUNTER — Telehealth: Payer: Self-pay | Admitting: Obstetrics and Gynecology

## 2014-10-29 NOTE — Telephone Encounter (Signed)
Great.  What are the BP numbers now? As long as they are down, we should be OK with proceeding with her surgery.

## 2014-10-29 NOTE — Telephone Encounter (Signed)
Pt says she went to primary care doctor for blood pressure and was put on Norvast 2.5mg . Says it has gone down since she started medication and wanted to let Dr Quincy Simmonds know.

## 2014-10-29 NOTE — Telephone Encounter (Signed)
Call to Dr Nnodi's office/ [facility], [place] with Alyse Low, will need update from Dr Orland Penman after patient appointment Friday 11-01-14 in preparation for surgery. Phone and fax numbers given.

## 2014-10-29 NOTE — Telephone Encounter (Signed)
Routing to Dr. Quincy Simmonds and Lamont Snowball, RN as patient was to advise of update.

## 2014-10-29 NOTE — Telephone Encounter (Signed)
Thank you for following through regarding her follow up with her PCP.  Please let me know the outcome of the visit with her PCP.

## 2014-10-29 NOTE — Telephone Encounter (Signed)
Call to patient, BP in PCP office yesterday 158/100 yesterday and was started on Norvasc 2.5 mg, took  first dose last night.  BP at home today 147/94, 140/86, and one elevate pressure that she cant recall numbers.   Return to PCP, Dr Orland Penman,  ( [phone]) on Friday am for BP recheck and possible dose adjustment.

## 2014-10-30 ENCOUNTER — Other Ambulatory Visit (HOSPITAL_COMMUNITY): Payer: Managed Care, Other (non HMO)

## 2014-10-30 NOTE — Telephone Encounter (Signed)
I called # [phone] and left VM for patient to remind her of fasting labs, asked that she let us know if breast and colon screening were already done as we would need copies of reports.

## 2014-10-31 ENCOUNTER — Other Ambulatory Visit: Payer: Self-pay | Admitting: Primary Care

## 2014-10-31 ENCOUNTER — Ambulatory Visit (HOSPITAL_COMMUNITY)
Admission: RE | Admit: 2014-10-31 | Discharge: 2014-10-31 | Disposition: A | Discharge status: Home / Self Care | Payer: Managed Care, Other (non HMO) | Source: Ambulatory Visit | Attending: Obstetrics and Gynecology | Admitting: Obstetrics and Gynecology

## 2014-10-31 DIAGNOSIS — Z1231 Encounter for screening mammogram for malignant neoplasm of breast: Secondary | ICD-10-CM | POA: Diagnosis not present

## 2014-10-31 NOTE — Telephone Encounter (Signed)
Thank you.  I'm assuming that she is not pregnant, but please confirm.  You know what they say about assuming.Marland Kitchen.Marland Kitchen..Marland Kitchen

## 2014-10-31 NOTE — Telephone Encounter (Addendum)
Patient returned call, said she will go to the lab within the next week, and she would like to go ahead with a colonoscopy and a mammogram. Please review referral and order. Thanks

## 2014-10-31 NOTE — Addendum Note (Signed)
Addended by: Jacqualin CombesBUDD, [place] LESLIE on: 10/31/2014 05:17 PM     Modules accepted: Orders

## 2014-10-31 NOTE — Addendum Note (Signed)
Addended by: Sandria SenterSTOLLER, Matricia Begnaud on: 10/31/2014 01:00 PM     Modules accepted: Orders

## 2014-11-01 NOTE — Telephone Encounter (Signed)
Call to patient to follow-up on BP recheck today. States BP was initially elevated at MD office but has been on 140/80 range when checking at home. She was sent home to get home cuff for comparison and recheck and BP had come down. PCP did clear her for surgery and was to send note to office. Remains on Norvasc 2.5 mg.  Letter from PCP received and sent to your office for review.

## 2014-11-04 ENCOUNTER — Other Ambulatory Visit: Payer: Self-pay | Admitting: Obstetrics and Gynecology

## 2014-11-04 DIAGNOSIS — R928 Other abnormal and inconclusive findings on diagnostic imaging of breast: Secondary | ICD-10-CM

## 2014-11-04 MED ORDER — DEXTROSE 5 % IV SOLN
2.0000 g | INTRAVENOUS | Status: AC
  Administered 2014-11-05: 2 g via INTRAVENOUS
  Filled 2014-11-04: qty 2

## 2014-11-04 NOTE — H&P (Signed)
Patient ID: Natasha Villegas, female   DOB: 03-Dec-1966, 48 y.o.   MRN: [id number] GYNECOLOGY  VISIT   HPI: 48 y.o.   Married  Caucasian  female    G2P2003 with Patient's last menstrual period was 10/14/2014 (exact date).   here to discuss surgery.    Patient having heavy and prolonged menses despite oral contraceptive therapy.   Combined OCPs stopped due to elevated blood pressure.   Will continue to take Micronor until surgery.   Ultrasound showed 8 intramural and subserosal fibroids ranging in size from 0.8 - 2.75 cm.  EMB showed simple focal endometrial hyperplasia without atypia.   Declines future childbearing.   OK to not take bowel prep.  Wants to go home the same day.   Seen at the [facility] for her preop visit and blood pressure noted to be elevated and so patient saw her PCP and was started on antihypertensive Norvasc 2.5 mg daily.   GYNECOLOGIC HISTORY: Patient's last menstrual period was 10/14/2014 (exact date). Contraception: OCPs    Menopausal hormone therapy: n/a Mammogram -  10/31/14 - Possible asymmetry of the left breast. Follow up diagnostic mammogram and possible ultrasound [facility].      OB History      Gravida  Para  Term  Preterm  AB  TAB  SAB  Ectopic  Multiple  Living     2  2  2              3           Obstetric Comments     1 adopted son in 3/94             Patient Active Problem List     Diagnosis  Date Noted   .  Malignant melanoma of right lower leg  03/11/2014       Past Medical History   Diagnosis  Date   .  Wears glasses         Past Surgical History   Procedure  Laterality  Date   .  Gynecologic cryosurgery           d/t heavy vaginal discharge   .  Tonsillectomy and adenoidectomy       .  Wisdom tooth extraction       .  Mass excision  Right  03/21/2014       Procedure: WIDE EXCISION MELANOMA RIGHT LEG;  Surgeon: Imogene Burn. Georgette Dover, MD;  Location: [facility];  Service:  General;  Laterality: Right;       Current Outpatient Prescriptions   Medication  Sig  Dispense  Refill   .  norethindrone (MICRONOR,CAMILA,ERRIN) 0.35 MG tablet  Take 1 tablet (0.35 mg total) by mouth daily.  1 Package  0      No current facility-administered medications for this visit.      ALLERGIES: Review of patient's allergies indicates no known allergies.    Family History   Problem  Relation  Age of Onset   .  Hypertension  Father     .  Heart attack  Father         bypass surgery   .  Heart disease  Father         History      Social History   .  Marital Status:  Married       Spouse Name:  N/A  Number of Children:  N/A   .  Years of Education:  N/A      Occupational History   .  Not on file.      Social History Main Topics   .  Smoking status:  Never Smoker    .  Smokeless tobacco:  Never Used   .  Alcohol Use:  No   .  Drug Use:  No   .  Sexual Activity:       Partners:  Male       Birth Control/ Protection:  OCP, Pill         Comment: Trisprintec      Other Topics  Concern   .  Not on file      Social History Narrative     ROS:  Pertinent items are noted in HPI.  PHYSICAL EXAMINATION:    BP 130/78 mmHg  Pulse 84  Ht 5\' 5"  (1.651 m)  Wt 146 lb 3.2 oz (66.316 kg)  BMI 24.33 kg/m2  LMP 10/14/2014 (Exact Date)     General appearance: alert, cooperative and appears stated age Lungs: clear to auscultation bilaterally Heart: regular rate and rhythm Abdomen: soft, non-tender; no masses,  no organomegaly No abnormal inguinal nodes palpated  10/07/14 -Pelvic: External genitalia: no lesions  Urethra: normal appearing urethra with no masses, tenderness or lesions  Bartholins and Skenes: normal   Vagina: normal appearing vagina with normal color and discharge, no lesions  Cervix: prominent anterior cervical lip.     Bimanual Exam: Uterus: uterus is normal size, shape,  consistency and nontender  Adnexa: normal adnexa in size, nontender and no masses                                   ASSESSMENT  Symptomatic fibroids.   Simple endometrial hyperplasia without atypia.   On Micronor. Recent diagnosis of HTN.  On Norvasc.   PLAN  Proceed with robotic total laparoscopic hysterectomy with bilateral salpingectomy and cystoscopy, possible bilateral oophorectomy. Benefits, risks, and alternatives discussed with the patient who wishes to proceed.   Risks include but are not limited to bleeding, infection, damage to surrounding organs, reaction to anesthesia, DVT, PE, death, vaginal cuff dehiscence, hernias, neuropathy, need for reoperation, possible laparotomy, menopausal symptoms if ovaries are removed unless estrogen therapy is initiated.

## 2014-11-04 NOTE — Telephone Encounter (Signed)
Follow up from patient's PCP received.  She was cleared for surgery.  Her blood pressure at her visit was 140/86, 142/90.

## 2014-11-05 ENCOUNTER — Encounter (HOSPITAL_COMMUNITY)
Admission: RE | Disposition: A | Discharge status: Home / Self Care | Payer: Self-pay | Source: Ambulatory Visit | Attending: Obstetrics and Gynecology

## 2014-11-05 ENCOUNTER — Ambulatory Visit (HOSPITAL_COMMUNITY): Payer: Managed Care, Other (non HMO) | Admitting: Anesthesiology

## 2014-11-05 ENCOUNTER — Encounter (HOSPITAL_COMMUNITY): Payer: Self-pay | Admitting: *Deleted

## 2014-11-05 ENCOUNTER — Observation Stay (HOSPITAL_COMMUNITY)
Admission: RE | Admit: 2014-11-05 | Discharge: 2014-11-06 | Disposition: A | Discharge status: Home / Self Care | Payer: Managed Care, Other (non HMO) | Source: Ambulatory Visit | Attending: Obstetrics and Gynecology | Admitting: Obstetrics and Gynecology

## 2014-11-05 DIAGNOSIS — N8501 Benign endometrial hyperplasia: Secondary | ICD-10-CM

## 2014-11-05 DIAGNOSIS — I10 Essential (primary) hypertension: Secondary | ICD-10-CM | POA: Insufficient documentation

## 2014-11-05 DIAGNOSIS — D259 Leiomyoma of uterus, unspecified: Secondary | ICD-10-CM | POA: Diagnosis present

## 2014-11-05 DIAGNOSIS — D72829 Elevated white blood cell count, unspecified: Secondary | ICD-10-CM | POA: Diagnosis not present

## 2014-11-05 DIAGNOSIS — N8500 Endometrial hyperplasia, unspecified: Secondary | ICD-10-CM | POA: Diagnosis not present

## 2014-11-05 DIAGNOSIS — D252 Subserosal leiomyoma of uterus: Secondary | ICD-10-CM | POA: Diagnosis not present

## 2014-11-05 DIAGNOSIS — Z90710 Acquired absence of both cervix and uterus: Secondary | ICD-10-CM | POA: Diagnosis present

## 2014-11-05 HISTORY — PX: CYSTOSCOPY: SHX5120

## 2014-11-05 HISTORY — PX: ROBOTIC ASSISTED TOTAL HYSTERECTOMY: SHX6085

## 2014-11-05 LAB — CBC
HCT: 34 % — ABNORMAL LOW (ref 36.0–46.0)
Hemoglobin: 11 g/dL — ABNORMAL LOW (ref 12.0–15.0)
MCH: 25.1 pg — ABNORMAL LOW (ref 26.0–34.0)
MCHC: 32.4 g/dL (ref 30.0–36.0)
MCV: 77.4 fL — AB (ref 78.0–100.0)
PLATELETS: 322 10*3/uL (ref 150–400)
RBC: 4.39 MIL/uL (ref 3.87–5.11)
RDW: 14.5 % (ref 11.5–15.5)
WBC: 12.9 10*3/uL — ABNORMAL HIGH (ref 4.0–10.5)

## 2014-11-05 LAB — PREGNANCY, URINE: PREG TEST UR: NEGATIVE

## 2014-11-05 SURGERY — ROBOTIC ASSISTED TOTAL HYSTERECTOMY
Anesthesia: General | Site: Urethra

## 2014-11-05 MED ORDER — SODIUM CHLORIDE 0.9 % IV SOLN
INTRAVENOUS | Status: DC | PRN
  Administered 2014-11-05: 60 mL

## 2014-11-05 MED ORDER — KETOROLAC TROMETHAMINE 30 MG/ML IJ SOLN
INTRAMUSCULAR | Status: DC | PRN
  Administered 2014-11-05: 30 mg via INTRAVENOUS
  Administered 2014-11-05: 30 mg via INTRAMUSCULAR

## 2014-11-05 MED ORDER — DEXAMETHASONE SODIUM PHOSPHATE 10 MG/ML IJ SOLN
INTRAMUSCULAR | Status: DC | PRN
  Administered 2014-11-05: 4 mg via INTRAVENOUS

## 2014-11-05 MED ORDER — NEOSTIGMINE METHYLSULFATE 10 MG/10ML IV SOLN
INTRAVENOUS | Status: AC
  Filled 2014-11-05: qty 1

## 2014-11-05 MED ORDER — DOCUSATE SODIUM 100 MG PO CAPS
100.0000 mg | ORAL_CAPSULE | Freq: Two times a day (BID) | ORAL | Status: DC
  Filled 2014-11-05: qty 1

## 2014-11-05 MED ORDER — GLYCOPYRROLATE 0.2 MG/ML IJ SOLN
INTRAMUSCULAR | Status: DC | PRN
  Administered 2014-11-05: 0.4 mg via INTRAVENOUS

## 2014-11-05 MED ORDER — PHENYLEPHRINE 40 MCG/ML (10ML) SYRINGE FOR IV PUSH (FOR BLOOD PRESSURE SUPPORT)
PREFILLED_SYRINGE | INTRAVENOUS | Status: AC
  Filled 2014-11-05: qty 5

## 2014-11-05 MED ORDER — FENTANYL CITRATE 0.05 MG/ML IJ SOLN: 25.0000 ug | INTRAMUSCULAR | Status: DC | PRN

## 2014-11-05 MED ORDER — GLYCOPYRROLATE 0.2 MG/ML IJ SOLN
INTRAMUSCULAR | Status: AC
  Filled 2014-11-05: qty 2

## 2014-11-05 MED ORDER — LIDOCAINE HCL (CARDIAC) 20 MG/ML IV SOLN
INTRAVENOUS | Status: DC | PRN
  Administered 2014-11-05: 50 mg via INTRAVENOUS

## 2014-11-05 MED ORDER — SCOPOLAMINE 1 MG/3DAYS TD PT72
1.0000 | MEDICATED_PATCH | Freq: Once | TRANSDERMAL | Status: DC
  Administered 2014-11-05: 1.5 mg via TRANSDERMAL

## 2014-11-05 MED ORDER — OXYCODONE-ACETAMINOPHEN 5-325 MG PO TABS
1.0000 | ORAL_TABLET | ORAL | Status: DC | PRN
  Administered 2014-11-05: 1 via ORAL
  Filled 2014-11-05: qty 1

## 2014-11-05 MED ORDER — LACTATED RINGERS IR SOLN
Status: DC | PRN
  Administered 2014-11-05: 3000 mL

## 2014-11-05 MED ORDER — LACTATED RINGERS IV SOLN
INTRAVENOUS | Status: DC
  Administered 2014-11-05 (×2): via INTRAVENOUS

## 2014-11-05 MED ORDER — ONDANSETRON HCL 4 MG/2ML IJ SOLN
INTRAMUSCULAR | Status: DC | PRN
  Administered 2014-11-05: 4 mg via INTRAVENOUS

## 2014-11-05 MED ORDER — ROCURONIUM BROMIDE 100 MG/10ML IV SOLN
INTRAVENOUS | Status: AC
  Filled 2014-11-05: qty 1

## 2014-11-05 MED ORDER — BUPIVACAINE HCL (PF) 0.25 % IJ SOLN
INTRAMUSCULAR | Status: AC
  Filled 2014-11-05: qty 30

## 2014-11-05 MED ORDER — ONDANSETRON HCL 4 MG/2ML IJ SOLN
INTRAMUSCULAR | Status: AC
  Filled 2014-11-05: qty 2

## 2014-11-05 MED ORDER — SODIUM CHLORIDE 0.9 % IJ SOLN
INTRAMUSCULAR | Status: AC
  Filled 2014-11-05: qty 50

## 2014-11-05 MED ORDER — LIDOCAINE HCL (CARDIAC) 20 MG/ML IV SOLN
INTRAVENOUS | Status: AC
  Filled 2014-11-05: qty 5

## 2014-11-05 MED ORDER — KETOROLAC TROMETHAMINE 30 MG/ML IJ SOLN
INTRAMUSCULAR | Status: AC
  Filled 2014-11-05: qty 2

## 2014-11-05 MED ORDER — ROPIVACAINE HCL 5 MG/ML IJ SOLN
INTRAMUSCULAR | Status: AC
  Filled 2014-11-05: qty 60

## 2014-11-05 MED ORDER — PROMETHAZINE HCL 25 MG/ML IJ SOLN: 6.2500 mg | INTRAMUSCULAR | Status: DC | PRN

## 2014-11-05 MED ORDER — SODIUM CHLORIDE 0.9 % IJ SOLN
INTRAMUSCULAR | Status: AC
  Filled 2014-11-05: qty 10

## 2014-11-05 MED ORDER — ARTIFICIAL TEARS OP OINT
TOPICAL_OINTMENT | OPHTHALMIC | Status: AC
  Filled 2014-11-05: qty 3.5

## 2014-11-05 MED ORDER — PROPOFOL 10 MG/ML IV BOLUS
INTRAVENOUS | Status: DC | PRN
  Administered 2014-11-05: 160 mg via INTRAVENOUS

## 2014-11-05 MED ORDER — PHENYLEPHRINE HCL 10 MG/ML IJ SOLN
INTRAMUSCULAR | Status: DC | PRN
  Administered 2014-11-05: 40 ug via INTRAVENOUS
  Administered 2014-11-05: 80 ug via INTRAVENOUS
  Administered 2014-11-05: 40 ug via INTRAVENOUS

## 2014-11-05 MED ORDER — PROPOFOL 10 MG/ML IV EMUL
INTRAVENOUS | Status: AC
  Filled 2014-11-05: qty 20

## 2014-11-05 MED ORDER — DEXAMETHASONE SODIUM PHOSPHATE 4 MG/ML IJ SOLN
INTRAMUSCULAR | Status: AC
  Filled 2014-11-05: qty 1

## 2014-11-05 MED ORDER — ACETAMINOPHEN 10 MG/ML IV SOLN
1000.0000 mg | Freq: Once | INTRAVENOUS | Status: AC
  Administered 2014-11-05: 1000 mg via INTRAVENOUS
  Filled 2014-11-05: qty 100

## 2014-11-05 MED ORDER — SCOPOLAMINE 1 MG/3DAYS TD PT72
MEDICATED_PATCH | TRANSDERMAL | Status: AC
  Filled 2014-11-05: qty 1

## 2014-11-05 MED ORDER — ONDANSETRON HCL 4 MG PO TABS: 4.0000 mg | ORAL_TABLET | Freq: Four times a day (QID) | ORAL | Status: DC | PRN

## 2014-11-05 MED ORDER — NEOSTIGMINE METHYLSULFATE 10 MG/10ML IV SOLN
INTRAVENOUS | Status: DC | PRN
  Administered 2014-11-05: 2 mg via INTRAVENOUS

## 2014-11-05 MED ORDER — MENTHOL 3 MG MT LOZG
1.0000 | LOZENGE | OROMUCOSAL | Status: DC | PRN
  Administered 2014-11-05: 3 mg via ORAL
  Filled 2014-11-05: qty 9

## 2014-11-05 MED ORDER — STERILE WATER FOR IRRIGATION IR SOLN
Status: DC | PRN
  Administered 2014-11-05: 1000 mL

## 2014-11-05 MED ORDER — IBUPROFEN 600 MG PO TABS
600.0000 mg | ORAL_TABLET | Freq: Four times a day (QID) | ORAL | Status: DC | PRN
  Administered 2014-11-05: 600 mg via ORAL
  Filled 2014-11-05: qty 1

## 2014-11-05 MED ORDER — BUPIVACAINE HCL (PF) 0.25 % IJ SOLN
INTRAMUSCULAR | Status: DC | PRN
  Administered 2014-11-05: 9 mL

## 2014-11-05 MED ORDER — MIDAZOLAM HCL 2 MG/2ML IJ SOLN
INTRAMUSCULAR | Status: AC
  Filled 2014-11-05: qty 2

## 2014-11-05 MED ORDER — MEPERIDINE HCL 25 MG/ML IJ SOLN: 6.2500 mg | INTRAMUSCULAR | Status: DC | PRN

## 2014-11-05 MED ORDER — MIDAZOLAM HCL 2 MG/2ML IJ SOLN
INTRAMUSCULAR | Status: DC | PRN
  Administered 2014-11-05: 2 mg via INTRAVENOUS

## 2014-11-05 MED ORDER — KETOROLAC TROMETHAMINE 30 MG/ML IJ SOLN: 15.0000 mg | Freq: Once | INTRAMUSCULAR | Status: DC | PRN

## 2014-11-05 MED ORDER — ROCURONIUM BROMIDE 100 MG/10ML IV SOLN
INTRAVENOUS | Status: DC | PRN
  Administered 2014-11-05: 10 mg via INTRAVENOUS
  Administered 2014-11-05: 40 mg via INTRAVENOUS
  Administered 2014-11-05: 10 mg via INTRAVENOUS

## 2014-11-05 MED ORDER — AMLODIPINE BESYLATE 2.5 MG PO TABS
2.5000 mg | ORAL_TABLET | Freq: Every day | ORAL | Status: DC
  Administered 2014-11-05: 2.5 mg via ORAL
  Filled 2014-11-05: qty 1

## 2014-11-05 MED ORDER — ONDANSETRON HCL 4 MG/2ML IJ SOLN: 4.0000 mg | Freq: Four times a day (QID) | INTRAMUSCULAR | Status: DC | PRN

## 2014-11-05 MED ORDER — FENTANYL CITRATE 0.05 MG/ML IJ SOLN
INTRAMUSCULAR | Status: DC | PRN
  Administered 2014-11-05: 50 ug via INTRAVENOUS
  Administered 2014-11-05: 100 ug via INTRAVENOUS

## 2014-11-05 MED ORDER — MORPHINE SULFATE 4 MG/ML IJ SOLN: 2.0000 mg | INTRAMUSCULAR | Status: DC | PRN

## 2014-11-05 MED ORDER — MIDAZOLAM HCL 2 MG/2ML IJ SOLN: 0.5000 mg | Freq: Once | INTRAMUSCULAR | Status: DC | PRN

## 2014-11-05 MED ORDER — FENTANYL CITRATE 0.05 MG/ML IJ SOLN
INTRAMUSCULAR | Status: AC
  Filled 2014-11-05: qty 5

## 2014-11-05 MED ORDER — LACTATED RINGERS IV SOLN: INTRAVENOUS | Status: DC

## 2014-11-05 SURGICAL SUPPLY — 62 items
BARRIER ADHS 3X4 INTERCEED (GAUZE/BANDAGES/DRESSINGS) ×1 IMPLANT
BRR ADH 4X3 ABS CNTRL BYND (GAUZE/BANDAGES/DRESSINGS) ×2
CANISTER SUCT 3000ML (MISCELLANEOUS) ×3 IMPLANT
CATH FOLEY 3WAY  5CC 16FR (CATHETERS) ×1
CATH FOLEY 3WAY 5CC 16FR (CATHETERS) ×2 IMPLANT
CLOTH BEACON ORANGE TIMEOUT ST (SAFETY) ×3 IMPLANT
CONT PATH 16OZ SNAP LID 3702 (MISCELLANEOUS) ×6 IMPLANT
COVER BACK TABLE 60X90IN (DRAPES) ×6 IMPLANT
COVER TIP SHEARS 8 DVNC (MISCELLANEOUS) ×4 IMPLANT
COVER TIP SHEARS 8MM DA VINCI (MISCELLANEOUS) ×2
DECANTER SPIKE VIAL GLASS SM (MISCELLANEOUS) ×6 IMPLANT
DRAPE HUG U DISPOSABLE (DRAPE) ×3 IMPLANT
DRAPE HYSTEROSCOPY (DRAPE) IMPLANT
DRAPE WARM FLUID 44X44 (DRAPE) ×3 IMPLANT
DRSG COVADERM PLUS 2X2 (GAUZE/BANDAGES/DRESSINGS) ×1 IMPLANT
DRSG OPSITE POSTOP 3X4 (GAUZE/BANDAGES/DRESSINGS) ×1 IMPLANT
DURAPREP 26ML APPLICATOR (WOUND CARE) ×3 IMPLANT
ELECT REM PT RETURN 9FT ADLT (ELECTROSURGICAL) ×3
ELECTRODE REM PT RTRN 9FT ADLT (ELECTROSURGICAL) ×2 IMPLANT
EVACUATOR SMOKE 8.L (FILTER) ×3 IMPLANT
GAUZE VASELINE 3X9 (GAUZE/BANDAGES/DRESSINGS) IMPLANT
GLOVE BIO SURGEON STRL SZ 6.5 (GLOVE) ×9 IMPLANT
GLOVE BIOGEL PI IND STRL 6.5 (GLOVE) ×2 IMPLANT
GLOVE BIOGEL PI IND STRL 7.0 (GLOVE) ×4 IMPLANT
GLOVE BIOGEL PI INDICATOR 6.5 (GLOVE) ×1
GLOVE BIOGEL PI INDICATOR 7.0 (GLOVE) ×2
GOWN STRL REUS W/TWL LRG LVL3 (GOWN DISPOSABLE) ×30 IMPLANT
KIT ACCESSORY DA VINCI DISP (KITS) ×1
KIT ACCESSORY DVNC DISP (KITS) ×2 IMPLANT
LEGGING LITHOTOMY PAIR STRL (DRAPES) ×3 IMPLANT
LIQUID BAND (GAUZE/BANDAGES/DRESSINGS) ×3 IMPLANT
OCCLUDER COLPOPNEUMO (BALLOONS) ×1 IMPLANT
PACK ROBOT WH (CUSTOM PROCEDURE TRAY) ×3 IMPLANT
PACK VAGINAL WOMENS (CUSTOM PROCEDURE TRAY) ×3 IMPLANT
PAD PREP 24X48 CUFFED NSTRL (MISCELLANEOUS) ×6 IMPLANT
PAD TRENDELENBURG POSITION (MISCELLANEOUS) ×3 IMPLANT
PROTECTOR NERVE ULNAR (MISCELLANEOUS) ×6 IMPLANT
SET CYSTO W/LG BORE CLAMP LF (SET/KITS/TRAYS/PACK) ×3 IMPLANT
SET IRRIG TUBING LAPAROSCOPIC (IRRIGATION / IRRIGATOR) ×6 IMPLANT
SUT VIC AB 0 CT2 27 (SUTURE) ×12 IMPLANT
SUT VIC AB 2-0 SH 27 (SUTURE) ×6
SUT VIC AB 2-0 SH 27XBRD (SUTURE) ×6 IMPLANT
SUT VIC AB 2-0 UR6 27 (SUTURE) IMPLANT
SUT VIC AB 4-0 PS2 27 (SUTURE) ×6 IMPLANT
SUT VICRYL 0 UR6 27IN ABS (SUTURE) ×6 IMPLANT
SUT VLOC 180 0 9IN  GS21 (SUTURE) ×1
SUT VLOC 180 0 9IN GS21 (SUTURE) IMPLANT
SYR 50ML LL SCALE MARK (SYRINGE) ×6 IMPLANT
SYSTEM CONVERTIBLE TROCAR (TROCAR) IMPLANT
TIP RUMI ORANGE 6.7MMX12CM (TIP) IMPLANT
TIP UTERINE 5.1X6CM LAV DISP (MISCELLANEOUS) IMPLANT
TIP UTERINE 6.7X10CM GRN DISP (MISCELLANEOUS) ×1 IMPLANT
TIP UTERINE 6.7X6CM WHT DISP (MISCELLANEOUS) IMPLANT
TIP UTERINE 6.7X8CM BLUE DISP (MISCELLANEOUS) IMPLANT
TOWEL OR 17X24 6PK STRL BLUE (TOWEL DISPOSABLE) ×9 IMPLANT
TRAY FOLEY CATH 14FR (SET/KITS/TRAYS/PACK) ×3 IMPLANT
TROCAR DILATING TIP 12MM 150MM (ENDOMECHANICALS) ×3 IMPLANT
TROCAR DISP BLADELESS 8 DVNC (TROCAR) ×2 IMPLANT
TROCAR DISP BLADELESS 8MM (TROCAR) ×1
TROCAR XCEL NON-BLD 5MMX100MML (ENDOMECHANICALS) ×1 IMPLANT
WARMER LAPAROSCOPE (MISCELLANEOUS) ×3 IMPLANT
WATER STERILE IRR 1000ML POUR (IV SOLUTION) ×9 IMPLANT

## 2014-11-05 NOTE — Anesthesia Postprocedure Evaluation (Signed)
  Anesthesia Post Note  Patient: Natasha Villegas  Procedure(s) Performed: Procedure(s) (LRB): ROBOTIC ASSISTED TOTAL HYSTERECTOMY WITH BILATERAL SALPINGECTOMY  (Bilateral) CYSTOSCOPY (N/A)  Anesthesia type: GA  Patient location: PACU  Post pain: Pain level controlled  Post assessment: Post-op Vital signs reviewed  Last Vitals:  Filed Vitals:   11/05/14 1030  BP: 143/75  Pulse: 96  Temp:   Resp: 16    Post vital signs: Reviewed  Level of consciousness: sedated  Complications: No apparent anesthesia complications

## 2014-11-05 NOTE — Addendum Note (Signed)
Addendum  created 11/05/14 1349 by Raenette Rover, CRNA   Modules edited: Notes Section   Notes Section:  File: [id number]

## 2014-11-05 NOTE — Progress Notes (Signed)
Day of Surgery Procedure(s) (LRB): ROBOTIC ASSISTED TOTAL HYSTERECTOMY WITH BILATERAL SALPINGECTOMY  (Bilateral) CYSTOSCOPY (N/A)  Subjective: Patient reports tolerating PO.   Some headache and lower abdominal discomfort.  Taking ibuprofen only.  Has not tried Percocet yet.  Ambulating in room to bathroom on her own. Voiding often.  Sore throat.  IV out.   Objective: I have reviewed patient's vital signs, intake and output and labs. T 97.6, BP 146/80, P 96, RR 17 I/O 2740/805.  WBC 12. 9, Hgb 11.0 General: alert Resp: clear to auscultation bilaterally Cardio: regular rate and rhythm, S1, S2 normal, no murmur, click, rub or gallop GI: soft, non-tender; bowel sounds normal; no masses,  no organomegaly and incision: clean, dry and intact Extremities: extremities normal, atraumatic, no cyanosis or edema and  PAS on.  DPs 2+ bilaterally.  Vaginal Bleeding: none  Assessment: s/p Procedure(s): ROBOTIC ASSISTED TOTAL HYSTERECTOMY WITH BILATERAL SALPINGECTOMY  (Bilateral) CYSTOSCOPY (N/A): progressing well and  mildly elevated WBC.   Plan: Encourage ambulation  Percocet prn pain.   Will observe overnight.  CBC with diff in am.  Reviewed surgical findings and procedure.  Questions answered.    LOS: 0 days    Arloa Koh 11/05/2014, 5:33 PM

## 2014-11-05 NOTE — Transfer of Care (Signed)
Immediate Anesthesia Transfer of Care Note  Patient: Natasha Villegas  Procedure(s) Performed: Procedure(s): ROBOTIC ASSISTED TOTAL HYSTERECTOMY WITH BILATERAL SALPINGECTOMY  (Bilateral) CYSTOSCOPY (N/A)  Patient Location: PACU  Anesthesia Type:General  Level of Consciousness: awake  Airway & Oxygen Therapy: Patient Spontanous Breathing  Post-op Assessment: Report given to PACU RN  Post vital signs: stable  Filed Vitals:   11/05/14 0616  BP: 131/89  Pulse: 96  Temp: 36.9 C  Resp: 18    Complications: No apparent anesthesia complications

## 2014-11-05 NOTE — Brief Op Note (Signed)
11/05/2014  9:50 AM  PATIENT:  Natasha Villegas  48 y.o. female  PRE-OPERATIVE DIAGNOSIS:  symptomatic uterine fibroids, simple endometrial hyperplasia  POST-OPERATIVE DIAGNOSIS:  symptomatic uterine fibroids, simple endometrial hyperplasia  PROCEDURE:  Procedure(s): ROBOTIC ASSISTED TOTAL HYSTERECTOMY WITH BILATERAL SALPINGECTOMY  (Bilateral) CYSTOSCOPY (N/A)  SURGEON:  Surgeon(s) and Role:    * Lyman Speller, MD - Dunlo, MD - Primary  PHYSICIAN ASSISTANT: NONE  ASSISTANTS:  Lyman Speller, MD   ANESTHESIA:   local and general  EBL:  Total I/O In: 1300 [I.V.:1300] Out: 255 [Urine:205; Blood:50]  BLOOD ADMINISTERED:none  DRAINS:  none  LOCAL MEDICATIONS USED:  MARCAINE     SPECIMEN:  Source of Specimen:   uterus, cervix, bilateral tubes  DISPOSITION OF SPECIMEN:  PATHOLOGY  COUNTS:  YES  TOURNIQUET:  * No tourniquets in log *  DICTATION: .Other Dictation: Dictation Number    PLAN OF CARE:  discharge to home after short stay [facility]  PATIENT DISPOSITION:  PACU - hemodynamically stable.   Delay start of Pharmacological VTE agent (>24hrs) due to surgical blood loss or risk of bleeding: not applicable

## 2014-11-05 NOTE — Op Note (Signed)
Natasha Villegas, Natasha Villegas                  ACCOUNT NO.:  [id number]  MEDICAL RECORD NO.:  [id number]  LOCATION:  1324                          FACILITY:  [facility]  PHYSICIAN:  Lenard Galloway, M.D.   DATE OF BIRTH:  [birth year]  DATE OF PROCEDURE:  11/05/2014 DATE OF DISCHARGE:                              OPERATIVE REPORT   PREOPERATIVE DIAGNOSIS:  Symptomatic uterine fibroids, simple endometrial hyperplasia.  POSTOPERATIVE DIAGNOSIS:  Symptomatic uterine fibroids, simple endometrial hyperplasia.  PROCEDURES:  A da Vinci robotic total laparoscopic hysterectomy with bilateral salpingectomy and cystoscopy.  SURGEON:  Lenard Galloway, M.D.  ASSISTANT:  Felipa Emory, MD  ANESTHESIA:  General endotracheal, local with 0.25% Marcaine, intraperitoneal ropivacaine, IV Tylenol.  IV FLUIDS FOR THE PROCEDURE:  1300 mL Ringer's lactate.  EBL:  50 mL.  URINE OUTPUT:  205 mL.  COMPLICATIONS:  None.  INDICATIONS FOR THE PROCEDURE:  The patient is a 48 year old, gravida 2, para 2-0-0-3 Caucasian female, who presented with heavy and prolonged menstruation despite taking combined oral contraceptive therapy.  The patient underwent an ultrasound documenting a multi-fibroid uterus with both intramural and subserosal fibroids ranging in size from 0.8-2.75 cm.  An endometrial biopsy documented focal simple endometrial hyperplasia without atypia.  The patient declined any future childbearing.  The patient developed high blood pressure while taking the combined oral contraceptives and these were therefore discontinued and transition to a progesterone only birth control pill.  Her blood pressure required pharmacologic management.  A plan was made to proceed with a robotic total laparoscopic hysterectomy with bilateral salpingectomy and cystoscopy after risks, benefits, and alternatives were reviewed.  FINDINGS:  Exam under anesthesia revealed a mildly enlarged uterus.  The cervix was noted to be  multiparous.  The bilateral fallopian tubes and ovaries were unremarkable.  There was no evidence of endometriosis in the abdomen or the pelvis.  In the upper abdomen, the liver appeared unremarkable.  The bowel demonstrated no obvious lesions.  The appendix was normal.  SPECIMENS:  The uterus, cervix, and bilateral fallopian tubes were sent to Pathology together.  DESCRIPTION OF PROCEDURE:  The patient was reidentified in the preoperative hold area.  She received cefotetan 2 g IV for antibiotic prophylaxis.  She received TED hose and PAS stockings for DVT prophylaxis.  In the operating room, the patient received general endotracheal anesthetic.  She was placed in the dorsal lithotomy position with her arms at her sides.  The patient had her arms tucked at her sides and she was appropriately padded.  The abdomen and the vagina were sterilely prepped and the patient was draped.  A speculum was placed inside the vagina and a single-tooth tenaculum was placed on the anterior cervical lip.  The uterus was sounded to 10 cm.  A figure-of-eight suture of 0 Vicryl was placed on each the anterior and the posterior cervical lips. A KOH ring with a 10-cm RUMI tip and the RUMI device were then placed inside the uterine cavity and the balloon was inflated.  The remaining vaginal instruments were removed.  The procedure began by injecting the umbilicus with .25% Marcaine and then placing a small inferior umbilical incision in order  to insert the Veress needle into the peritoneal cavity.  The saline drop test was performed and the fluid flowed freely.  The CO2 pneumoperitoneum was achieved at this time.  A 03-KV supraumbilical region was then injected with 0.25% Marcaine.  A 12-mm incision was created with a scalpel.  A trocar was then placed inside the peritoneal cavity.  The laparoscope confirmed proper placement.  The laparoscopic ports were then mapped on the abdomen.  The 8-mm ports were then  placed 10 cm to the right and left of the umbilical incision after the skin was injected locally with Marcaine and the incisions were created.  These trocars were placed under visualization of the laparoscope.  A 5-mm incision was created in the right lower quadrant. Again, after the skin was injected with 0.25% Marcaine, placement of the assistant port trocar occurred under direct visualization.  At this time, ropivacaine diluted in normal saline was inserted into the peritoneal cavity.  The patient was then placed in the Trendelenburg position.  The robot was docked.  The PK instrument was placed in port #2 and the monopolar scissors was placed and port #1 both under direct visualization of the camera.  At this time, I stepped to the surgeon's counsel while my assistant stayed at the bedside.  The procedure began with a survey of the pelvic anatomy.  The left ureter was identified.  The left fallopian tube was elevated at this time and the salpingectomy was begun using a combination of the PK instrument and the monopolar cutting device.  The cautery and cutting began at the distal end of the fallopian tube and extended through the mesosalpinx to the proximal end.  The left utero-ovarian ligament was then cauterized with the PK instrument and was bisected with monopolar cautery.  The left round ligament was then grasped with a PK instrument, cauterized and bisected with the monopolar cautery scissors.  Dissection continued through the anterior and posterior leaves of the broad ligament using the laparoscopic monopolar scissors.  The bladder flap was taken down anteriorly using the same monopolar cautery instrument. The uterine artery was partially skeletonized on the patient's left-hand side again using the monopolar cautery scissors.  After the bladder flap was more adequately taken down, the left uterine artery was then adequately isolated so that it could be cauterized multiple  times with the PK instrument and then bisected using the monopolar scissors.  The same procedure that was performed on the patient's left-hand side was then repeated on the patient's right hand side after the right ureter was carefully identified.  The salpingectomy, isolation and bisection of the right mesosalpinx, right utero-ovarian ligament, and the right round and broad ligaments were performed as done on the patient's left-hand side.  The bladder flap continued to be taken down on the patient's right-hand side.  The right uterine artery was then isolated and was cauterized multiple times with the PK instrument and then bisected with the monopolar scissors.  At this time, there was adequate blanching of the uterus and there was excellent visualization of the KOH ring.  The colpotomy was therefore created with monopolar cautery scissors and this was performed in a clockwise fashion.  Hemostasis was good.  The uterus and cervix were completely freed at this time and the entire specimen was removed from the vagina.  The balloon was then insufflated in the vagina.  The vaginal cuff was then sutured at this time with a running suture of 0 V-Loc.  The closure began on  the patient's right-hand side and extended to the left vaginal cuff and then back to sutures toward the midline.  There was a tiny bit of blood, which appeared to be near the uterine arteries on the patient's left-hand side.  There was no active bleeding, however, decision was made to proceed with one final cauterization of the uterine artery pedicle at this time.  The vaginal cuff was elevated with the suture, which had not yet been cut as the pedicle was gently grasped with the PK instrument and cauterized. Hemostasis was excellent.  The suture was cut at this time.  It was attached to the peritoneum superficially overlying the bladder.  At this time, the Interceed was introduced into the peritoneal cavity after the  ropivacaine was suctioned away.  The Interceed was placed over the vaginal cuff.  All of the pedicle sites had been previously examined, then had excellent hemostasis.  The needle was retrieved from within the peritoneal cavity at this time. My assistant removed the trocars under visualization of the laparoscope after the robot was undocked.  The CO2 pneumoperitoneum was released. Manual breaths were given to remove any remaining gas from the umbilical trocar.  The fascia of the umbilical incision was closed with a figure- of-eight suture of 0 Vicryl.  The skin incisions were closed with subcuticular sutures of 4-0 Vicryl.  Dermabond and sterile bandages were placed over the incisions.  While these incisions were closed, I did the cystoscopy from below.  The vaginal occluder balloon had already been removed.  I removed the Foley catheter.  Cystoscopy was performed, which indicated that the ureters were patent bilaterally.  The bladder was visualized throughout 360 degrees including the bladder dome and trigone.  There was no evidence of a foreign body noted.  The bladder was drained of all cystoscopic fluid.  The vaginal cuff was examined and was noted to be hemostatic and intact.  The patient was awakened and extubated and escorted to the recovery room in stable condition.  There were no complications to the procedure.  All needle, instrument, and sponge counts were correct.     Lenard Galloway, M.D.     BES/MEDQ  D:  11/05/2014  T:  11/05/2014  Job:  [id number]

## 2014-11-05 NOTE — Anesthesia Preprocedure Evaluation (Signed)
Anesthesia Evaluation  Patient identified by MRN, date of birth, ID band Patient awake    Reviewed: Allergy & Precautions, H&P , Patient's Chart, lab work & pertinent test results, reviewed documented beta blocker date and time   Airway Mallampati: II  TM Distance: >3 FB Neck ROM: full    Dental no notable dental hx.    Pulmonary  breath sounds clear to auscultation  Pulmonary exam normal       Cardiovascular hypertension (Recently started, no CAD SX), On Medications Rhythm:regular Rate:Normal     Neuro/Psych    GI/Hepatic   Endo/Other    Renal/GU      Musculoskeletal   Abdominal   Peds  Hematology   Anesthesia Other Findings   Reproductive/Obstetrics                             Anesthesia Physical Anesthesia Plan  ASA: II  Anesthesia Plan: General   Post-op Pain Management:    Induction: Intravenous  Airway Management Planned: Oral ETT  Additional Equipment:   Intra-op Plan:   Post-operative Plan: Extubation in OR  Informed Consent: I have reviewed the patients History and Physical, chart, labs and discussed the procedure including the risks, benefits and alternatives for the proposed anesthesia with the patient or authorized representative who has indicated his/her understanding and acceptance.   Dental Advisory Given and Dental advisory given  Plan Discussed with: CRNA and Surgeon  Anesthesia Plan Comments: (  Discussed general anesthesia, including possible nausea, instrumentation of airway, sore throat,pulmonary aspiration, etc. I asked if the were any outstanding questions, or  concerns before we proceeded. )        Anesthesia Quick Evaluation

## 2014-11-05 NOTE — Progress Notes (Signed)
Update to History and Physical  No marked change in status.  Patient examined.   OK to proceed with surgery.  

## 2014-11-05 NOTE — Anesthesia Postprocedure Evaluation (Signed)
  Anesthesia Post-op Note  Patient: Natasha Villegas  Procedure(s) Performed: Procedure(s): ROBOTIC ASSISTED TOTAL HYSTERECTOMY WITH BILATERAL SALPINGECTOMY  (Bilateral) CYSTOSCOPY (N/A)  Patient Location: [facility]  Anesthesia Type:General  Level of Consciousness: awake, alert , oriented and patient cooperative  Airway and Oxygen Therapy: Patient Spontanous Breathing  Post-op Pain: mild  Post-op Assessment: Post-op Vital signs reviewed, Patient's Cardiovascular Status Stable, Respiratory Function Stable, Patent Airway and No signs of Nausea or vomiting  Post-op Vital Signs: Reviewed and stable  Last Vitals:  Filed Vitals:   11/05/14 1220  BP: 139/78  Pulse: 98  Temp: 37.3 C  Resp: 16    Complications: No apparent anesthesia complications

## 2014-11-06 ENCOUNTER — Encounter (HOSPITAL_COMMUNITY): Payer: Self-pay | Admitting: Obstetrics and Gynecology

## 2014-11-06 DIAGNOSIS — D252 Subserosal leiomyoma of uterus: Secondary | ICD-10-CM | POA: Diagnosis not present

## 2014-11-06 LAB — CBC WITH DIFFERENTIAL/PLATELET
BASOS ABS: 0 10*3/uL (ref 0.0–0.1)
BASOS PCT: 0 % (ref 0–1)
EOS ABS: 0.1 10*3/uL (ref 0.0–0.7)
Eosinophils Relative: 1 % (ref 0–5)
HCT: 31.8 % — ABNORMAL LOW (ref 36.0–46.0)
Hemoglobin: 10.3 g/dL — ABNORMAL LOW (ref 12.0–15.0)
Lymphocytes Relative: 27 % (ref 12–46)
Lymphs Abs: 2.9 10*3/uL (ref 0.7–4.0)
MCH: 25.1 pg — ABNORMAL LOW (ref 26.0–34.0)
MCHC: 32.4 g/dL (ref 30.0–36.0)
MCV: 77.4 fL — ABNORMAL LOW (ref 78.0–100.0)
MONO ABS: 1.4 10*3/uL — AB (ref 0.1–1.0)
Monocytes Relative: 13 % — ABNORMAL HIGH (ref 3–12)
NEUTROS ABS: 6.5 10*3/uL (ref 1.7–7.7)
Neutrophils Relative %: 59 % (ref 43–77)
Platelets: 328 10*3/uL (ref 150–400)
RBC: 4.11 MIL/uL (ref 3.87–5.11)
RDW: 14.6 % (ref 11.5–15.5)
WBC: 10.9 10*3/uL — ABNORMAL HIGH (ref 4.0–10.5)

## 2014-11-06 MED ORDER — OXYCODONE-ACETAMINOPHEN 5-325 MG PO TABS: 1.0000 | ORAL_TABLET | ORAL | Status: DC | PRN

## 2014-11-06 NOTE — Progress Notes (Signed)
Discharge instructions provided to patient and significant other at bedside.  Activity, medications, follow up appointments, incision care, when to call the doctor and community resources discussed.  No questions at this time.  Patient left unit in stable condition with all personal belongings and prescriptions accompanied by staff.  Leighton Roach, RN------------------------

## 2014-11-06 NOTE — Discharge Instructions (Signed)
Total Laparoscopic Hysterectomy, Care After °Refer to this sheet in the next few weeks. These instructions provide you with information on caring for yourself after your procedure. Your health care provider may also give you more specific instructions. Your treatment has been planned according to current medical practices, but problems sometimes occur. Call your health care provider if you have any problems or questions after your procedure. °WHAT TO EXPECT AFTER THE PROCEDURE °· Pain and bruising at the incision sites. You will be given pain medicine to control it. °· Menopausal symptoms such as hot flashes, night sweats, and insomnia if your ovaries were removed. °· Sore throat from the breathing tube that was inserted during surgery. °HOME CARE INSTRUCTIONS °· Only take over-the-counter or prescription medicines for pain, discomfort, or fever as directed by your health care provider.   °· Do not take aspirin. It can cause bleeding.   °· Do not drive when taking pain medicine.   °· Follow your health care provider's advice regarding diet, exercise, lifting, driving, and general activities.   °· Resume your usual diet as directed and allowed.   °· Get plenty of rest and sleep.   °· Do not douche, use tampons, or have sexual intercourse for at least 6 weeks, or until your health care provider gives you permission.   °· Change your bandages (dressings) as directed by your health care provider.   °· Monitor your temperature and notify your health care provider of a fever.   °· Take showers instead of baths for 2-3 weeks.   °· Do not drink alcohol until your health care provider gives you permission.   °· If you develop constipation, you may take a mild laxative with your health care provider's permission. Bran foods may help with constipation problems. Drinking enough fluids to keep your urine clear or pale yellow may help as well.   °· Try to have someone home with you for 1-2 weeks to help around the house.    °· Keep all of your follow-up appointments as directed by your health care provider.   °SEEK MEDICAL CARE IF: °· You have swelling, redness, or increasing pain around your incision sites.   °· You have pus coming from your incision.   °· You notice a bad smell coming from your incision.   °· Your incision breaks open.   °· You feel dizzy or lightheaded.   °· You have pain or bleeding when you urinate.   °· You have persistent diarrhea.   °· You have persistent nausea and vomiting.   °· You have abnormal vaginal discharge.   °· You have a rash.   °· You have any type of abnormal reaction or develop an allergy to your medicine.   °· You have poor pain control with your prescribed medicine.   °SEEK IMMEDIATE MEDICAL CARE IF: °· You have chest pain or shortness of breath. °· You have severe abdominal pain that is not relieved with pain medicine. °· You have pain or swelling in your legs. °MAKE SURE YOU: °· Understand these instructions. °· Will watch your condition. °· Will get help right away if you are not doing well or get worse. °Document Released: 09/26/2013 Document Revised: 12/11/2013 Document Reviewed: 09/26/2013 °ExitCare® Patient Information ©2015 [employer]. This information is not intended to replace advice given to you by your health care provider. Make sure you discuss any questions you have with your health care provider. ° °

## 2014-11-06 NOTE — Progress Notes (Signed)
1 Day Post-Op Procedure(s) (LRB): ROBOTIC ASSISTED TOTAL HYSTERECTOMY WITH BILATERAL SALPINGECTOMY  (Bilateral) CYSTOSCOPY (N/A)  Subjective: Patient reports tolerating PO, + flatus and no problems voiding.   Ambulated several times.  Sore throat following surgery.  Using Cepacol.   Objective: I have reviewed patient's vital signs, intake and output and labs. Tm 99.9, T now 99.5  WBC 10.9 with no left shift.  Hgb 10.3.  General: alert Resp: clear to auscultation bilaterally Cardio: regular rate and rhythm, S1, S2 normal, no murmur, click, rub or gallop GI: soft, non-tender; bowel sounds normal; no masses,  no organomegaly and incision: clean, dry and intact Extremities:  Ted hose on.  Vaginal Bleeding: none  Assessment: s/p Procedure(s): ROBOTIC ASSISTED TOTAL HYSTERECTOMY WITH BILATERAL SALPINGECTOMY  (Bilateral) CYSTOSCOPY (N/A): progressing well Ready for discharge.  WBC improved.   Plan: Discharge home  Instructions and precautions given.  Percocet and Motrin for pain.  Follow up in one week.    LOS: 1 day    Arloa Koh 11/06/2014, 7:49 AM

## 2014-11-06 NOTE — Telephone Encounter (Signed)
I called # [phone] twice and left VM for patient to call me in regards to a question, still waiting to hear back.    In the meantime:      Patient info faxed to [facility] so that they can contact her for colonoscopy.  Mammography order faxed to [facility].

## 2014-11-10 NOTE — Discharge Summary (Signed)
Physician Discharge Summary  Patient ID: Natasha Villegas MRN: [id number] DOB/AGE: December 02, 1966 48 y.o.  Admit date: 11/05/2014 Discharge date: 11/06/14  Admission Diagnoses: 1.  Symptomatic fibroids. 2.  Simple endometrial hyperplasia.  Discharge Diagnoses: 1.  Symptomatic fibroids. 2.  Simple endometrial hyperplasia. 3.  Status post daVinci total laparoscopic hysterectomy with bilateral salpingectomy, cystoscopy 4.  Post op mild leukocytosis.  Active Problems:   Status post laparoscopic hysterectomy   Discharged Condition: good  Hospital Course:  The patient was admitted on 11/05/14 for a daVinci robotic total laparoscopic hysterectomy with bilateral salpingectomy and cystoscopy which were performed without complication while under general anesthesia.  The patient's post op course was uneventful.  She received Toradol for pain control initially, and this was converted over to Percocet and Motrin when the patient began taking po well.  She ambulated independently and wore PAS and Ted hose for DVT prophylaxis while in bed.  The patient voided well soon after surgery. The patient's vital signs remained stable and she demonstrated no signs of infection during her hospitalization.  Her CBC showed a white blood cell count of 12.9 and Hgb of 11 on post op day zero.  The patient's post op day one WBC was 10.9 with 13% monocytes and no left shift and her Hgb was 10.3.  She had a Tmax of 99.9 during her hospital stay and had some throat soreness following the intubation from general anesthesia. She was found to be in good condition and ready for discharge on post op day one.    Consults: None  Significant Diagnostic Studies: labs:  See hospital course above.  Treatments: surgery:  daVinci robotic total laparoscopic hysterectomy with bilateral salpingectomy and cystoscopy performed 11/05/14.  Discharge Exam: Blood pressure 131/69, pulse 86, temperature 99.5 F (37.5 C), temperature source Oral, resp.  rate 18, height 5\' 5"  (1.651 m), weight 147 lb (66.679 kg), SpO2 97 %.  General: alert Resp: clear to auscultation bilaterally Cardio: regular rate and rhythm, S1, S2 normal, no murmur, click, rub or gallop GI: soft, non-tender; bowel sounds normal; no masses, no organomegaly and incision: clean, dry and intact Extremities:  Ted hose on.  Vaginal Bleeding: none  Disposition: 01-Home or Self Care  Instructions for post op care given in verbal and written manner.    Medication List    STOP taking these medications        norethindrone 0.35 MG tablet  Commonly known as:  MICRONOR,CAMILA,ERRIN      TAKE these medications        amLODipine 2.5 MG tablet  Commonly known as:  NORVASC  Take 2.5 mg by mouth at bedtime.     oxyCODONE-acetaminophen 5-325 MG per tablet  Commonly known as:  PERCOCET/ROXICET  Take 1-2 tablets by mouth every 4 (four) hours as needed for severe pain (moderate to severe pain (when tolerating fluids)).           Follow-up Information    Follow up with Arloa Koh, MD In 1 week.   Specialty:  Obstetrics and Gynecology   Contact information:   [address] [place] [address] [phone]       Signed: Arloa Koh 11/10/2014, 6:03 PM

## 2014-11-11 ENCOUNTER — Ambulatory Visit: Payer: Managed Care, Other (non HMO) | Admitting: Obstetrics and Gynecology

## 2014-11-18 ENCOUNTER — Encounter: Payer: Self-pay | Admitting: Obstetrics and Gynecology

## 2014-11-18 ENCOUNTER — Ambulatory Visit (INDEPENDENT_AMBULATORY_CARE_PROVIDER_SITE_OTHER): Payer: Managed Care, Other (non HMO) | Admitting: Obstetrics and Gynecology

## 2014-11-18 VITALS — BP 136/90 | HR 82 | Resp 18 | Ht 65.0 in | Wt 149.0 lb

## 2014-11-18 DIAGNOSIS — Z9889 Other specified postprocedural states: Secondary | ICD-10-CM

## 2014-11-18 DIAGNOSIS — R300 Dysuria: Secondary | ICD-10-CM

## 2014-11-18 LAB — POCT URINALYSIS DIPSTICK
Bilirubin, UA: NEGATIVE
GLUCOSE UA: NEGATIVE
Ketones, UA: NEGATIVE
Leukocytes, UA: NEGATIVE
Nitrite, UA: NEGATIVE
PH UA: 5
Protein, UA: NEGATIVE
RBC UA: NEGATIVE
UROBILINOGEN UA: NEGATIVE

## 2014-11-18 NOTE — Progress Notes (Signed)
GYNECOLOGY  VISIT   HPI: 48 y.o.   Married  Caucasian  female   G2P2003 with Patient's last menstrual period was 10/14/2014 (lmp unknown).   here for a post op check.  Status post robotic total laparoscopic hysterectomy with bilateral salpingectomy 11/05/14 for benign fibroids.  Feels some bladder urgency that comes and goes.  Has felt this since surgery. Voiding well.  No bleeding.  No fevers.  No nausea and vomiting.   Wants to increase activity.  GYNECOLOGIC HISTORY: Patient's last menstrual period was 10/14/2014 (lmp unknown). Contraception:    Menopausal hormone therapy:         OB History    Gravida Para Term Preterm AB TAB SAB Ectopic Multiple Living   2 2 2       3       Obstetric Comments   1 adopted son in 3/94         Patient Active Problem List   Diagnosis Date Noted  . Status post laparoscopic hysterectomy 11/05/2014  . Simple endometrial hyperplasia without atypia 10/23/2014  . Fibroids 10/23/2014  . Malignant melanoma of right lower leg 03/11/2014    Past Medical History  Diagnosis Date  . Wears glasses   . SVD (spontaneous vaginal delivery)     x 2    Past Surgical History  Procedure Laterality Date  . Gynecologic cryosurgery      d/t heavy vaginal discharge  . Tonsillectomy and adenoidectomy    . Wisdom tooth extraction    . Mass excision Right 03/21/2014    Procedure: WIDE EXCISION MELANOMA RIGHT LEG;  Surgeon: Imogene Burn. Georgette Dover, MD;  Location: [facility];  Service: General;  Laterality: Right;  . Eye surgery  1999    Bilateral Lasik  . Robotic assisted total hysterectomy Bilateral 11/05/2014    Procedure: ROBOTIC ASSISTED TOTAL HYSTERECTOMY WITH BILATERAL SALPINGECTOMY ;  Surgeon: Everardo All Amundson de Berton Lan, MD;  Location: [facility];  Service: Gynecology;  Laterality: Bilateral;  . Cystoscopy N/A 11/05/2014    Procedure: CYSTOSCOPY;  Surgeon: Jamey Reas de Berton Lan, MD;  Location: [facility];  Service:  Gynecology;  Laterality: N/A;    Current Outpatient Prescriptions  Medication Sig Dispense Refill  . amLODipine (NORVASC) 2.5 MG tablet Take 2.5 mg by mouth at bedtime.    Marland Kitchen oxyCODONE-acetaminophen (PERCOCET/ROXICET) 5-325 MG per tablet Take 1-2 tablets by mouth every 4 (four) hours as needed for severe pain (moderate to severe pain (when tolerating fluids)). (Patient not taking: Reported on 11/18/2014) 30 tablet 0   No current facility-administered medications for this visit.     ALLERGIES: Review of patient's allergies indicates no known allergies.  Family History  Problem Relation Age of Onset  . Hypertension Father   . Heart attack Father     bypass surgery  . Heart disease Father     History   Social History  . Marital Status: Married    Spouse Name: N/A    Number of Children: N/A  . Years of Education: N/A   Occupational History  . Not on file.   Social History Main Topics  . Smoking status: Never Smoker   . Smokeless tobacco: Never Used  . Alcohol Use: No  . Drug Use: No  . Sexual Activity:    Partners: Male    Birth Control/ Protection: OCP, Pill     Comment: Trisprintec   Other Topics Concern  . Not on file   Social History Narrative  ROS:  Pertinent items are noted in HPI.  PHYSICAL EXAMINATION:    BP 136/90 mmHg  Pulse 82  Resp 18  Ht 5\' 5"  (1.651 m)  Wt 149 lb (67.586 kg)  BMI 24.79 kg/m2  LMP 10/14/2014 (LMP Unknown)     General appearance: alert, cooperative and appears stated age   Abdomen: incisions intact, soft, non-tender; no masses,  no organomegaly   Pelvic: External genitalia:  no lesions              Urethra:  normal appearing urethra with no masses, tenderness or lesions              Bartholins and Skenes: normal                 Vagina: normal appearing vagina with normal color and discharge, no lesions, cuff intact.               Cervix: absent                   Bimanual Exam:  Uterus:   absent                                       Adnexa:  no masses                                        ASSESSMENT  Status post robotic total laparoscopic hysterectomy with bilateral salpingectomy. Dysuria.   PLAN  Urine dip.  If positive, will send for micro and culture. Activity level discussed with patient. Return for final post op visit.    An After Visit Summary was printed and given to the patient.  __15____ minutes face to face time of which over 50% was spent in counseling.

## 2014-11-20 ENCOUNTER — Ambulatory Visit
Admission: RE | Admit: 2014-11-20 | Discharge: 2014-11-20 | Disposition: A | Discharge status: Home / Self Care | Payer: Managed Care, Other (non HMO) | Source: Ambulatory Visit | Attending: Obstetrics and Gynecology | Admitting: Obstetrics and Gynecology

## 2014-11-20 DIAGNOSIS — R928 Other abnormal and inconclusive findings on diagnostic imaging of breast: Secondary | ICD-10-CM

## 2014-12-18 ENCOUNTER — Encounter: Payer: Self-pay | Admitting: Obstetrics and Gynecology

## 2014-12-18 ENCOUNTER — Ambulatory Visit (INDEPENDENT_AMBULATORY_CARE_PROVIDER_SITE_OTHER): Payer: Managed Care, Other (non HMO) | Admitting: Obstetrics and Gynecology

## 2014-12-18 VITALS — BP 122/80 | HR 80 | Ht 65.0 in | Wt 149.6 lb

## 2014-12-18 DIAGNOSIS — Z9889 Other specified postprocedural states: Secondary | ICD-10-CM

## 2014-12-18 NOTE — Progress Notes (Signed)
Patient ID: Natasha Villegas, female   DOB: Dec 01, 1966, 48 y.o.   MRN: [id number] GYNECOLOGY  VISIT   HPI: 48 y.o.   Married  Caucasian  female   G2P2003 with Patient's last menstrual period was 10/14/2014 (lmp unknown).   here for 6 week follow up.  Status post robotic total laparoscopic hysterectomy with bilateral salpingectomy and cystoscopy on 11/05/14.  Feeling good.  No problems.   GYNECOLOGIC HISTORY: Patient's last menstrual period was 10/14/2014 (lmp unknown). Contraception: Hysterectomy.   Menopausal hormone therapy: none        OB History    Gravida Para Term Preterm AB TAB SAB Ectopic Multiple Living   2 2 2       3       Obstetric Comments   1 adopted son in 3/94         Patient Active Problem List   Diagnosis Date Noted  . Status post laparoscopic hysterectomy 11/05/2014  . Simple endometrial hyperplasia without atypia 10/23/2014  . Fibroids 10/23/2014  . Malignant melanoma of right lower leg 03/11/2014    Past Medical History  Diagnosis Date  . Wears glasses   . SVD (spontaneous vaginal delivery)     x 2    Past Surgical History  Procedure Laterality Date  . Gynecologic cryosurgery      d/t heavy vaginal discharge  . Tonsillectomy and adenoidectomy    . Wisdom tooth extraction    . Mass excision Right 03/21/2014    Procedure: WIDE EXCISION MELANOMA RIGHT LEG;  Surgeon: Imogene Burn. Georgette Dover, MD;  Location: [facility];  Service: General;  Laterality: Right;  . Eye surgery  1999    Bilateral Lasik  . Robotic assisted total hysterectomy Bilateral 11/05/2014    Procedure: ROBOTIC ASSISTED TOTAL HYSTERECTOMY WITH BILATERAL SALPINGECTOMY ;  Surgeon: Everardo All Amundson de Berton Lan, MD;  Location: [facility];  Service: Gynecology;  Laterality: Bilateral;  . Cystoscopy N/A 11/05/2014    Procedure: CYSTOSCOPY;  Surgeon: Jamey Reas de Berton Lan, MD;  Location: [facility];  Service: Gynecology;  Laterality: N/A;    Current Outpatient Prescriptions   Medication Sig Dispense Refill  . amLODipine (NORVASC) 2.5 MG tablet Take 2.5 mg by mouth at bedtime.     No current facility-administered medications for this visit.     ALLERGIES: Review of patient's allergies indicates no known allergies.  Family History  Problem Relation Age of Onset  . Hypertension Father   . Heart attack Father     bypass surgery  . Heart disease Father     History   Social History  . Marital Status: Married    Spouse Name: N/A    Number of Children: N/A  . Years of Education: N/A   Occupational History  . Not on file.   Social History Main Topics  . Smoking status: Never Smoker   . Smokeless tobacco: Never Used  . Alcohol Use: No  . Drug Use: No  . Sexual Activity:    Partners: Male    Birth Control/ Protection: Surgical     Comment: R-TLH   Other Topics Concern  . Not on file   Social History Narrative    ROS:  Pertinent items are noted in HPI.  PHYSICAL EXAMINATION:    BP 122/80 mmHg  Pulse 80  Ht 5\' 5"  (1.651 m)  Wt 149 lb 9.6 oz (67.858 kg)  BMI 24.89 kg/m2  LMP 10/14/2014 (LMP Unknown)     General appearance:  alert, cooperative and appears stated age Abdomen: incisions intact, soft, non-tender; no masses,  no organomegaly  Pelvic: External genitalia:  no lesions              Urethra:  normal appearing urethra with no masses, tenderness or lesions              Bartholins and Skenes: normal                 Vagina: normal appearing vagina with normal color and discharge, no lesions              Cervix: absent.  Cuff intact.                    Bimanual Exam:  Uterus:  absent                                      Adnexa: no masses                                     ASSESSMENT  Doing well post op.   PLAN  Return to all normal activities including exercise and sexual activity at 8 weeks post op.  Follow up for yearly annual exams.   An After Visit Summary was printed and given to the patient.  _20_____ minutes face  to face time of which over 50% was spent in counseling.

## 2014-12-25 ENCOUNTER — Telehealth: Payer: Self-pay | Admitting: Obstetrics and Gynecology

## 2014-12-25 NOTE — Telephone Encounter (Signed)
Patient is asking to talk with billing. Patient says she called earlier today.

## 2014-12-25 NOTE — Telephone Encounter (Signed)
Pt calling with a billing question.

## 2015-01-31 ENCOUNTER — Other Ambulatory Visit: Payer: Self-pay | Admitting: Family Medicine

## 2015-01-31 DIAGNOSIS — M542 Cervicalgia: Secondary | ICD-10-CM

## 2015-01-31 DIAGNOSIS — M792 Neuralgia and neuritis, unspecified: Secondary | ICD-10-CM

## 2015-02-05 ENCOUNTER — Ambulatory Visit
Admission: RE | Admit: 2015-02-05 | Discharge: 2015-02-05 | Disposition: A | Discharge status: Home / Self Care | Payer: Managed Care, Other (non HMO) | Source: Ambulatory Visit | Attending: Family Medicine | Admitting: Family Medicine

## 2015-02-05 DIAGNOSIS — M792 Neuralgia and neuritis, unspecified: Secondary | ICD-10-CM

## 2015-02-05 DIAGNOSIS — M542 Cervicalgia: Secondary | ICD-10-CM

## 2015-02-26 ENCOUNTER — Ambulatory Visit: Payer: Managed Care, Other (non HMO) | Admitting: Obstetrics and Gynecology

## 2015-02-26 ENCOUNTER — Ambulatory Visit (INDEPENDENT_AMBULATORY_CARE_PROVIDER_SITE_OTHER): Payer: Managed Care, Other (non HMO) | Admitting: Obstetrics and Gynecology

## 2015-02-26 ENCOUNTER — Encounter: Payer: Self-pay | Admitting: Obstetrics and Gynecology

## 2015-02-26 VITALS — BP 122/74 | HR 80 | Resp 14 | Ht 66.0 in | Wt 149.8 lb

## 2015-02-26 DIAGNOSIS — Z01419 Encounter for gynecological examination (general) (routine) without abnormal findings: Secondary | ICD-10-CM

## 2015-02-26 DIAGNOSIS — Z1211 Encounter for screening for malignant neoplasm of colon: Secondary | ICD-10-CM | POA: Diagnosis not present

## 2015-02-26 DIAGNOSIS — Z0000 Encounter for general adult medical examination without abnormal findings: Secondary | ICD-10-CM

## 2015-02-26 LAB — COMPREHENSIVE METABOLIC PANEL
ALK PHOS: 72 U/L (ref 39–117)
ALT: 35 U/L (ref 0–35)
AST: 33 U/L (ref 0–37)
Albumin: 4 g/dL (ref 3.5–5.2)
BUN: 14 mg/dL (ref 6–23)
CALCIUM: 9.4 mg/dL (ref 8.4–10.5)
CO2: 24 meq/L (ref 19–32)
Chloride: 103 mEq/L (ref 96–112)
Creat: 0.77 mg/dL (ref 0.50–1.10)
Glucose, Bld: 85 mg/dL (ref 70–99)
Potassium: 4.3 mEq/L (ref 3.5–5.3)
Sodium: 139 mEq/L (ref 135–145)
TOTAL PROTEIN: 6.6 g/dL (ref 6.0–8.3)
Total Bilirubin: 0.3 mg/dL (ref 0.2–1.2)

## 2015-02-26 LAB — CBC
HCT: 38.9 % (ref 36.0–46.0)
Hemoglobin: 12.7 g/dL (ref 12.0–15.0)
MCH: 26.3 pg (ref 26.0–34.0)
MCHC: 32.6 g/dL (ref 30.0–36.0)
MCV: 80.7 fL (ref 78.0–100.0)
MPV: 10 fL (ref 8.6–12.4)
Platelets: 339 10*3/uL (ref 150–400)
RBC: 4.82 MIL/uL (ref 3.87–5.11)
RDW: 18.2 % — ABNORMAL HIGH (ref 11.5–15.5)
WBC: 6.6 10*3/uL (ref 4.0–10.5)

## 2015-02-26 LAB — LIPID PANEL
CHOL/HDL RATIO: 2.7 ratio
Cholesterol: 186 mg/dL (ref 0–200)
HDL: 69 mg/dL (ref >=46)
LDL Cholesterol: 103 mg/dL — ABNORMAL HIGH (ref 0–99)
TRIGLYCERIDES: 69 mg/dL (ref <150)
VLDL: 14 mg/dL (ref 0–40)

## 2015-02-26 LAB — HEMOGLOBIN, FINGERSTICK: Hemoglobin, fingerstick: 12.1 g/dL (ref 12.0–16.0)

## 2015-02-26 NOTE — Patient Instructions (Signed)

## 2015-02-26 NOTE — Progress Notes (Signed)
Patient ID: Natasha Villegas, female   DOB: April 14, 1966, 49 y.o.   MRN: [id number] 49 y.o. [id number] MarriedCaucasianF here for annual exam.   PCP:  None  Feels good following hysterectomy! Feels a little bit warm, but no significant hot flashes or night sweats.  No vaginal dryness.   Left arm pain:  Shooting pain.  Seen by her PCP and neurosurgeon. Patient is left handed.  Had an MRI and diagnosis of inflammation.  Placed on NSAIDs.   Patient's last menstrual period was 10/14/2014 (lmp unknown).          Sexually active: Yes.   female partner The current method of family planning is status post hysterectomy.    Exercising: No.  none. Smoker:  no  Health Maintenance: Pap:  02-20-14 wnl:neg HR HPV History of abnormal Pap:  no MMG:  11-01-14 left breast with asymmetry and had left diag. Mammogram 11-20-14 which revealed fibroglandular density otherwise normal.  Right breast was normal on screening.  Rec. Screening mammogram 1 year:[facility] Colonoscopy:  never BMD:   never TDaP:  08/2011 Screening Labs:  Hb today: 12.1, Urine today: Neg   reports that she has never smoked. She has never used smokeless tobacco. She reports that she does not drink alcohol or use illicit drugs.  Past Medical History  Diagnosis Date  . Wears glasses   . SVD (spontaneous vaginal delivery)     x 2    Past Surgical History  Procedure Laterality Date  . Gynecologic cryosurgery      d/t heavy vaginal discharge  . Tonsillectomy and adenoidectomy    . Wisdom tooth extraction    . Mass excision Right 03/21/2014    Procedure: WIDE EXCISION MELANOMA RIGHT LEG;  Surgeon: Imogene Burn. Georgette Dover, MD;  Location: [facility];  Service: General;  Laterality: Right;  . Eye surgery  1999    Bilateral Lasik  . Robotic assisted total hysterectomy Bilateral 11/05/2014    Procedure: ROBOTIC ASSISTED TOTAL HYSTERECTOMY WITH BILATERAL SALPINGECTOMY ;  Surgeon: Everardo All Amundson de Berton Lan, MD;  Location: [facility];  Service: Gynecology;  Laterality: Bilateral;  . Cystoscopy N/A 11/05/2014    Procedure: CYSTOSCOPY;  Surgeon: Jamey Reas de Berton Lan, MD;  Location: [facility];  Service: Gynecology;  Laterality: N/A;    Current Outpatient Prescriptions  Medication Sig Dispense Refill  . amLODipine (NORVASC) 2.5 MG tablet Take 2.5 mg by mouth at bedtime.    . nabumetone (RELAFEN) 500 MG tablet Take 500 mg by mouth 2 (two) times daily.  3   No current facility-administered medications for this visit.    Family History  Problem Relation Age of Onset  . Hypertension Father   . Heart attack Father     bypass surgery  . Heart disease Father   . Cancer Mother 84    Dec--bone CA    ROS:  Pertinent items are noted in HPI.  Otherwise, a comprehensive ROS was negative.  Exam:   BP 122/74 mmHg  Pulse 80  Resp 14  Ht 5\' 6"  (1.676 m)  Wt 149 lb 12.8 oz (67.949 kg)  BMI 24.19 kg/m2  LMP 10/14/2014 (LMP Unknown)      Height: 5\' 6"  (167.6 cm)  Ht Readings from Last 3 Encounters:  02/26/15 5\' 6"  (1.676 m)  12/18/14 5\' 5"  (1.651 m)  11/18/14 5\' 5"  (1.651 m)    General appearance: alert, cooperative and appears stated age Head: Normocephalic, without obvious abnormality, atraumatic  Neck: no adenopathy, supple, symmetrical, trachea midline and thyroid normal to inspection and palpation Lungs: clear to auscultation bilaterally Breasts: normal appearance, no masses or tenderness, Inspection negative, No nipple retraction or dimpling, No nipple discharge or bleeding, No axillary or supraclavicular adenopathy Heart: regular rate and rhythm Abdomen: soft, non-tender; bowel sounds normal; no masses,  no organomegaly Extremities: extremities normal, atraumatic, no cyanosis or edema Skin: Skin color, texture, turgor normal. No rashes or lesions Lymph nodes: Cervical, supraclavicular, and axillary nodes normal. No abnormal inguinal nodes palpated Neurologic: Grossly normal   Pelvic: External  genitalia:  no lesions              Urethra:  normal appearing urethra with no masses, tenderness or lesions              Bartholins and Skenes: normal                 Vagina: normal appearing vagina with normal color and discharge, no lesions              Cervix: absent              Pap taken: No. Bimanual Exam:  Uterus:  uterus absent              Adnexa: no mass, fullness, tenderness               Rectovaginal: Confirms               Anus:  normal sphincter tone, no lesions  Chaperone was present for exam.  A:  Well Woman with normal exam Status post robotic hysterectomy with bilateral salpingectomy and cystoscopy.   P:   Mammogram yearly.  pap smear not indicated.  IFOB to patient.  She declines colonoscopy.  Discussed cardio and weight bearing exercise.  Routine labs.  return annually or prn

## 2015-03-06 ENCOUNTER — Telehealth: Payer: Self-pay

## 2015-03-06 NOTE — Telephone Encounter (Signed)
Left message to call Kaitlyn at [phone]. 

## 2015-03-06 NOTE — Telephone Encounter (Signed)
Returning a call to Kaitlyn. °

## 2015-03-06 NOTE — Telephone Encounter (Signed)
Spoke with patient. Patient states that she dropped of IFOB yesterday. Advised patient IFOB testing will be run and she will be called with the results. Patient is agreeable and verbalizes understanding.  Routing to provider for final review. Patient agreeable to disposition. Will close encounter

## 2015-03-06 NOTE — Telephone Encounter (Signed)
-----   Message from Nunzio Cobbs, MD sent at 03/06/2015  6:53 AM EDT ----- Regarding: please contact patient to send in IFOB card Please contact patient to have her send in her IFOB card. She come in one week ago for her annual exam.   Thank you,  Natasha Villegas ----- Message -----    From: SYSTEM    Sent: 03/03/2015  12:04 AM      To: Nunzio Cobbs, MD

## 2015-03-11 ENCOUNTER — Telehealth: Payer: Self-pay | Admitting: Primary Care

## 2015-03-11 NOTE — Telephone Encounter (Signed)
Spoke with patient regarding referral for colonoscopy for [facility]. Patient would like to defer referral until October 2016. Patient states she has many vacations coming up and does not want to have any issues. Referral will be closed and reopened when patient is ready to schedule.

## 2015-03-12 LAB — FECAL OCCULT BLOOD, IMMUNOCHEMICAL: IFOBT: NEGATIVE

## 2015-05-05 ENCOUNTER — Telehealth: Payer: Self-pay | Admitting: Primary Care

## 2015-05-05 NOTE — Telephone Encounter (Signed)
LM for patient to return my call to review.

## 2015-05-05 NOTE — Telephone Encounter (Signed)
Order entered in Sept 2015 for mammo, per notes patient was going to [facility]  Nothing scanned in so I called [facility], patient cancelled and did not reschedule, she did this on 01/24/15.  Do you want me to reach out to her to see if she went somewhere else? thanks

## 2015-05-05 NOTE — Telephone Encounter (Signed)
Please

## 2015-05-08 NOTE — Telephone Encounter (Signed)
I called and LM for patient to return my call to review.

## 2015-05-12 NOTE — Telephone Encounter (Signed)
I called and left a third message for patient.  Ok to close order?  Has not been seen in office since 2012..Marland Kitchen

## 2015-05-13 NOTE — Telephone Encounter (Signed)
Carrie Munoz, can you assist with letter for need for patient to make appt?

## 2015-05-13 NOTE — Telephone Encounter (Signed)
Letter please

## 2015-05-13 NOTE — Telephone Encounter (Signed)
See pended letter. Thanks

## 2015-05-15 NOTE — Telephone Encounter (Signed)
Letter will be mailed out to patient's home address tomorrow 05/16/15.

## 2015-05-15 NOTE — Telephone Encounter (Signed)
Sent to print; thanks!

## 2015-07-29 ENCOUNTER — Other Ambulatory Visit: Payer: Self-pay | Admitting: Neurosurgery

## 2015-07-29 DIAGNOSIS — M5412 Radiculopathy, cervical region: Secondary | ICD-10-CM

## 2015-07-31 ENCOUNTER — Ambulatory Visit
Admission: RE | Admit: 2015-07-31 | Discharge: 2015-07-31 | Disposition: A | Discharge status: Home / Self Care | Payer: Managed Care, Other (non HMO) | Source: Ambulatory Visit | Attending: Neurosurgery | Admitting: Neurosurgery

## 2015-07-31 DIAGNOSIS — M5412 Radiculopathy, cervical region: Secondary | ICD-10-CM

## 2015-07-31 MED ORDER — MEPERIDINE HCL 100 MG/ML IJ SOLN
50.0000 mg | Freq: Once | INTRAMUSCULAR | Status: AC
  Administered 2015-07-31: 50 mg via INTRAMUSCULAR

## 2015-07-31 MED ORDER — IOHEXOL 300 MG/ML  SOLN
10.0000 mL | Freq: Once | INTRAMUSCULAR | Status: DC | PRN
  Administered 2015-07-31: 10 mL via INTRATHECAL

## 2015-07-31 MED ORDER — DIAZEPAM 5 MG PO TABS
5.0000 mg | ORAL_TABLET | Freq: Once | ORAL | Status: AC
  Administered 2015-07-31: 5 mg via ORAL

## 2015-07-31 MED ORDER — ONDANSETRON HCL 4 MG/2ML IJ SOLN
4.0000 mg | Freq: Once | INTRAMUSCULAR | Status: AC
Start: 2015-07-31 — End: 2015-07-31
  Administered 2015-07-31: 4 mg via INTRAMUSCULAR

## 2015-07-31 NOTE — Discharge Instructions (Signed)

## 2015-10-21 HISTORY — PX: SHOULDER SURGERY: SHX246

## 2016-02-02 ENCOUNTER — Other Ambulatory Visit: Payer: Self-pay | Admitting: Gastroenterology

## 2016-03-12 ENCOUNTER — Ambulatory Visit (INDEPENDENT_AMBULATORY_CARE_PROVIDER_SITE_OTHER): Payer: 59 | Admitting: Obstetrics and Gynecology

## 2016-03-12 ENCOUNTER — Encounter: Payer: Self-pay | Admitting: Obstetrics and Gynecology

## 2016-03-12 VITALS — BP 130/84 | HR 72 | Ht 65.25 in | Wt 145.0 lb

## 2016-03-12 DIAGNOSIS — Z0000 Encounter for general adult medical examination without abnormal findings: Secondary | ICD-10-CM

## 2016-03-12 DIAGNOSIS — Z01419 Encounter for gynecological examination (general) (routine) without abnormal findings: Secondary | ICD-10-CM | POA: Diagnosis not present

## 2016-03-12 LAB — POCT URINALYSIS DIPSTICK
BILIRUBIN UA: NEGATIVE
Blood, UA: NEGATIVE
GLUCOSE UA: NEGATIVE
Ketones, UA: NEGATIVE
Leukocytes, UA: NEGATIVE
NITRITE UA: NEGATIVE
Protein, UA: NEGATIVE
Urobilinogen, UA: NEGATIVE
pH, UA: 6

## 2016-03-12 NOTE — Patient Instructions (Signed)

## 2016-03-12 NOTE — Progress Notes (Signed)
Patient ID: Natasha Villegas, female   DOB: 04-Aug-1966, 50 y.o.   MRN: [id number]  50 y.o. G33P2002 Married Caucasian female here for annual exam.    Hx of robotic TLH and bilateral salpingectomy.   No hot flashes.   Developed left arm pain. Saw a neurosurgeon - Dr. Hal Neer.  Had MRI and myelogram which were normal. Saw Dr. Veverly Fells, and dx is a torn labrum and bone spurs.  Left shoulder surgery and PT improved pain.  Daughter had twins one month ago! This is the daughter whose child died not long ago.  PCP:  [facility] when needed, does not go regularly   Patient's last menstrual period was 10/14/2014 (approximate).          Sexually active: Yes.    The current method of family planning is status post hysterectomy and bilateral salpingectomy.  Exercising: No.  The patient does not participate in regular exercise at present. Smoker:  no  Health Maintenance: Pap:02/20/14, Negative with neg HR HPV History of abnormal Pap: no MMG: 11-01-14 left breast with asymmetry and had left diag. Mammogram 11-20-14 which revealed fibroglandular density otherwise normal. Right breast was normal on screening. Rec. Screening mammogram 1 year:[facility] Colonoscopy: Never.  She declines colonoscopy.  IFOB negative 2016. Declines IFOB this year. [id number] TDaP: 08/2011 Screening Labs:  Hb today: declined, Urine today: neg.   reports that she has never smoked. She has never used smokeless tobacco. She reports that she does not drink alcohol or use illicit drugs.  Past Medical History  Diagnosis Date  . Wears glasses   . SVD (spontaneous vaginal delivery)     x 2    Past Surgical History  Procedure Laterality Date  . Gynecologic cryosurgery      d/t heavy vaginal discharge  . Tonsillectomy and adenoidectomy    . Wisdom tooth extraction    . Mass excision Right 03/21/2014    Procedure: WIDE EXCISION MELANOMA RIGHT LEG;  Surgeon: Imogene Burn. Georgette Dover, MD;  Location: [place];  Service: General;  Laterality: Right;  . Eye surgery  1999    Bilateral Lasik  . Robotic assisted total hysterectomy Bilateral 11/05/2014    Procedure: ROBOTIC ASSISTED TOTAL HYSTERECTOMY WITH BILATERAL SALPINGECTOMY ;  Surgeon: Everardo All Amundson de Berton Lan, MD;  Location: [facility];  Service: Gynecology;  Laterality: Bilateral;  . Cystoscopy N/A 11/05/2014    Procedure: CYSTOSCOPY;  Surgeon: Jamey Reas de Berton Lan, MD;  Location: [facility];  Service: Gynecology;  Laterality: N/A;    No current outpatient prescriptions on file.   No current facility-administered medications for this visit.    Family History  Problem Relation Age of Onset  . Hypertension Father   . Heart attack Father     bypass surgery  . Heart disease Father   . Cancer Mother 71    Dec--bone CA    ROS:  Pertinent items are noted in HPI.  Otherwise, a comprehensive ROS was negative.  Exam:   BP 130/84 mmHg  Pulse 72  Ht 5' 5.25" (1.657 m)  Wt 145 lb (65.772 kg)  BMI 23.95 kg/m2  LMP 10/14/2014 (Approximate)    General appearance: alert, cooperative and appears stated age Head: Normocephalic, without obvious abnormality, atraumatic Neck: no adenopathy, supple, symmetrical, trachea midline and thyroid normal to inspection and palpation Lungs: clear to auscultation bilaterally Breasts: normal appearance, no masses or tenderness, Inspection negative, No nipple retraction or dimpling, No nipple discharge or bleeding,  No axillary or supraclavicular adenopathy Heart: regular rate and rhythm Abdomen: soft, non-tender; bowel sounds normal; no masses,  no organomegaly Extremities: extremities normal, atraumatic, no cyanosis or edema Skin: Skin color, texture, turgor normal. No rashes or lesions Lymph nodes: Cervical, supraclavicular, and axillary nodes normal. No abnormal inguinal nodes palpated Neurologic: Grossly normal  Pelvic: External genitalia:  no lesions              Urethra:   normal appearing urethra with no masses, tenderness or lesions              Bartholins and Skenes: normal                 Vagina: normal appearing vagina with normal color and discharge, no lesions              Cervix: absent              Pap taken: No. Bimanual Exam:  Uterus:  uterus absent              Adnexa: normal adnexa and no mass, fullness, tenderness              Rectovaginal: Yes.  .  Confirms.              Anus:  normal sphincter tone, no lesions  Chaperone was present for exam.  Assessment:   Well woman visit with normal exam. Status post robotic TLH and bilateral salpingectomy.   Plan: Yearly mammogram recommended after age 75.  Patient will schedule. Recommended self breast exam.  Pap and HR HPV as above. Discussed Calcium, Vitamin D, regular exercise program including cardiovascular and weight bearing exercise. Labs performed.  No..    Discussed Hep C testing.  Refills given on medications.  No..    Follow up annually and prn.      After visit summary provided.

## 2016-03-25 ENCOUNTER — Encounter: Payer: Self-pay | Admitting: Primary Care

## 2016-06-02 ENCOUNTER — Telehealth: Payer: Self-pay | Admitting: Primary Care

## 2016-06-02 NOTE — Telephone Encounter (Signed)
Review of "Order Information" basket     05/27/16 - order has expired / no response to 2 reminder letters  02/02/16 - order has expired / no response to 2 reminder letters  01/05/16 - order has expired / no response to 2 reminder letters    09/05/15 - Mailing another reminder letter  05/16/15 - Mailed letter   Cancelled and did not reschedule - 01/24/15 mfs    11/01/14 - pt will schedule w/ [facility]

## 2016-07-20 NOTE — Progress Notes (Signed)
Cardiology Office Note   Date:  07/22/2016   ID:  Natasha Villegas, DOB Sep 24, 1966, MRN [id number]  PCP:  No PCP Per Patient  Cardiologist:   Minus Breeding, MD   Chief Complaint  Patient presents with  . Chest Pain      History of Present Illness: Natasha Villegas is a 50 y.o. female who presents for evaluation of chest discomfort. She's been having this for about 6 months. She says it happens when she walks typically. However, it doesn't happen to 2 miles. She will get a chest discomfort that is to the right side were mid chest. It feels like muscle cramping. She keeps on walking. It might get to 6 out of 10 in intensity. It goes away a few minutes after she stops. It has been a stable pattern. There is no radiation to her jaw or to her arms. She doesn't have any associated symptoms such as nausea vomiting. She has no excessive diaphoresis. She doesn't have any resting shortness of breath, PND or orthopnea. She doesn't describe presyncope or syncope. She's had some episodes of feeling like her heart was pounding at night. This seems to be very sporadic. She's also had some hip pain. This is occasional seems to hurt more when she's walking. She's not getting any calf or lower leg discomfort.  She has no past cardiac history although she has a family history of early heart disease. She's had no prior cardiac testing.   Past Medical History:  Diagnosis Date  . SVD (spontaneous vaginal delivery)    x 2  . Wears glasses     Past Surgical History:  Procedure Laterality Date  . CYSTOSCOPY N/A 11/05/2014   Procedure: CYSTOSCOPY;  Surgeon: Jamey Reas de Berton Lan, MD;  Location: [facility];  Service: Gynecology;  Laterality: N/A;  . EYE SURGERY  1999   Bilateral Lasik  . GYNECOLOGIC CRYOSURGERY     d/t heavy vaginal discharge  . MASS EXCISION Right 03/21/2014   Procedure: WIDE EXCISION MELANOMA RIGHT LEG;  Surgeon: Imogene Burn. Georgette Dover, MD;  Location: [place];  Service:  General;  Laterality: Right;  . ROBOTIC ASSISTED TOTAL HYSTERECTOMY Bilateral 11/05/2014   Procedure: ROBOTIC ASSISTED TOTAL HYSTERECTOMY WITH BILATERAL SALPINGECTOMY ;  Surgeon: Everardo All Amundson de Berton Lan, MD;  Location: [facility];  Service: Gynecology;  Laterality: Bilateral;  . TONSILLECTOMY AND ADENOIDECTOMY    . WISDOM TOOTH EXTRACTION      Meds: None No current outpatient prescriptions on file.   No current facility-administered medications for this visit.     Allergies:   Review of patient's allergies indicates no known allergies.    Social History:  The patient  reports that she has never smoked. She has never used smokeless tobacco. She reports that she does not drink alcohol or use drugs.   Family History:  The patient's family history includes Cancer (age of onset: 60) in her mother; Heart attack (age of onset: 38) in her father; Hypertension in her father.    ROS:  Please see the history of present illness.   Otherwise, review of systems are positive for none.   All other systems are reviewed and negative.     PHYSICAL EXAM: VS:  BP (!) 156/94   Pulse 72   Ht 5\' 5"  (1.651 m)   Wt 143 lb (64.9 kg)   LMP 10/14/2014 (Approximate)   BMI 23.80 kg/m  , BMI Body mass index is 23.8 kg/m. GENERAL:  Well appearing HEENT:  Pupils equal round and reactive, fundi not visualized, oral mucosa unremarkable NECK:  No jugular venous distention, waveform within normal limits, carotid upstroke brisk and symmetric, no bruits, no thyromegaly LYMPHATICS:  No cervical, inguinal adenopathy LUNGS:  Clear to auscultation bilaterally BACK:  No CVA tenderness CHEST:  Unremarkable HEART:  PMI not displaced or sustained,S1 and S2 within normal limits, no S3, no S4, no clicks, no rubs, no murmurs ABD:  Flat, positive bowel sounds normal in frequency in pitch, no bruits, no rebound, no guarding, no midline pulsatile mass, no hepatomegaly, no splenomegaly EXT:  2 plus pulses throughout,  no edema, no cyanosis no clubbing SKIN:  No rashes no nodules NEURO:  Cranial nerves II through XII grossly intact, motor grossly intact throughout PSYCH:  Cognitively intact, oriented to person place and time    EKG:  EKG is not ordered today. The ekg ordered today demonstrates sinus rhythm, rate 72, axis within normal limits, intervals within normal limits, no acute ST-T wave changes.   Recent Labs: No results found for requested labs within last 8760 hours.    Lipid Panel    Component Value Date/Time   CHOL 186 02/26/2015 1155   TRIG 69 02/26/2015 1155   HDL 69 02/26/2015 1155   CHOLHDL 2.7 02/26/2015 1155   VLDL 14 02/26/2015 1155   LDLCALC 103 (H) 02/26/2015 1155      Wt Readings from Last 3 Encounters:  07/22/16 143 lb (64.9 kg)  03/12/16 145 lb (65.8 kg)  02/26/15 149 lb 12.8 oz (67.9 kg)      Other studies Reviewed: Additional studies/ records that were reviewed today include: Office records from Dr. Quincy Simmonds.  (Of note her last LDL was 103 with an HDL of 69. Review of the above records demonstrates:  Please see elsewhere in the note.     ASSESSMENT AND PLAN:  CHEST PAIN:  I think this is unlikely to be obstructive CAD.  The pain is atypical. She doesn't have significant risk factors other than family history. However, with a family history her would like to screen her. I will bring the patient back for a POET (Plain Old Exercise Test). This will allow me to screen for obstructive coronary disease, risk stratify and very importantly provide a prescription for exercise.  I will bring her back for a coronary calcium scores well. This will further guide therapy.  HIP PAIN: This is very atypical and she has excellent peripheral pulses. I don't think this represents peripheral vascular disease. No change in therapy is indicated.    Current medicines are reviewed at length with the patient today.  The patient does not have concerns regarding medicines.  The following  changes have been made:  no change  Labs/ tests ordered today include:   Orders Placed This Encounter  Procedures  . CT CARDIAC SCORING  . EXERCISE TOLERANCE TEST  . EKG 12-Lead     Disposition:   FU with me as needed.     Signed, Minus Breeding, MD  07/22/2016 1:24 PM    [facility]  This year and I will see him

## 2016-07-22 ENCOUNTER — Ambulatory Visit (INDEPENDENT_AMBULATORY_CARE_PROVIDER_SITE_OTHER): Payer: 59 | Admitting: Cardiology

## 2016-07-22 ENCOUNTER — Encounter: Payer: Self-pay | Admitting: Cardiology

## 2016-07-22 VITALS — BP 156/94 | HR 72 | Ht 65.0 in | Wt 143.0 lb

## 2016-07-22 DIAGNOSIS — R072 Precordial pain: Secondary | ICD-10-CM

## 2016-07-22 NOTE — Patient Instructions (Signed)
Medication Instructions:  Continue current medications  Labwork: NONE  Testing/Procedures: Your physician has requested that you have an exercise tolerance test. For further information please visit [email or web address]. Please also follow instruction sheet, as given.  Your physician has requested that you have a Coronary Calcium Score Test, this test is done at our [facility].  Follow-Up: Your physician recommends that you schedule a follow-up appointment in: As Needed   Any Other Special Instructions Will Be Listed Below (If Applicable).   If you need a refill on your cardiac medications before your next appointment, please call your pharmacy.

## 2016-08-11 ENCOUNTER — Ambulatory Visit (INDEPENDENT_AMBULATORY_CARE_PROVIDER_SITE_OTHER): Payer: 59

## 2016-08-11 ENCOUNTER — Ambulatory Visit (INDEPENDENT_AMBULATORY_CARE_PROVIDER_SITE_OTHER)
Admission: RE | Admit: 2016-08-11 | Discharge: 2016-08-11 | Disposition: A | Discharge status: Home / Self Care | Payer: Self-pay | Source: Ambulatory Visit | Attending: Cardiology | Admitting: Cardiology

## 2016-08-11 DIAGNOSIS — R072 Precordial pain: Secondary | ICD-10-CM

## 2016-08-11 LAB — EXERCISE TOLERANCE TEST
CHL RATE OF PERCEIVED EXERTION: 16
CSEPHR: 102 %
CSEPPHR: 173 {beats}/min
Estimated workload: 10.1 METS
Exercise duration (min): 9 min
Exercise duration (sec): 0 s
MPHR: 168 {beats}/min
Rest HR: 99 {beats}/min

## 2016-08-16 ENCOUNTER — Telehealth: Payer: Self-pay | Admitting: *Deleted

## 2016-08-16 DIAGNOSIS — R9439 Abnormal result of other cardiovascular function study: Secondary | ICD-10-CM

## 2016-08-16 NOTE — Telephone Encounter (Signed)
-----   Message from Minus Breeding, MD sent at 08/13/2016  5:42 PM EDT ----- Discussed with the patient. This was not a high risk test that she went 9 minutes. However, she is having the symptoms as described. She needs further imaging and I will order a coronary CT angiogram. This will allow [place] to further identify the need for any intervention or invasive procedures and 4 further direction on risk management such as lipid goals.

## 2016-08-17 NOTE — Telephone Encounter (Signed)
-----   Message from Minus Breeding, MD sent at 08/13/2016  5:42 PM EDT ----- Discussed with the patient. This was not a high risk test that she went 9 minutes. However, she is having the symptoms as described. She needs further imaging and I will order a coronary CT angiogram. This will allow [place] to further identify the need for any intervention or invasive procedures and 4 further direction on risk management such as lipid goals.

## 2016-08-17 NOTE — Telephone Encounter (Signed)
CT angio ordered place send to scheduler to be schedule

## 2016-08-18 ENCOUNTER — Other Ambulatory Visit: Payer: Self-pay | Admitting: *Deleted

## 2016-08-18 DIAGNOSIS — R9439 Abnormal result of other cardiovascular function study: Secondary | ICD-10-CM

## 2016-08-27 ENCOUNTER — Encounter: Payer: Self-pay | Admitting: Cardiology

## 2016-09-01 ENCOUNTER — Encounter (HOSPITAL_COMMUNITY): Payer: Self-pay

## 2016-09-01 ENCOUNTER — Ambulatory Visit (HOSPITAL_COMMUNITY)
Admission: RE | Admit: 2016-09-01 | Discharge: 2016-09-01 | Disposition: A | Discharge status: Home / Self Care | Payer: 59 | Source: Ambulatory Visit | Attending: Cardiology | Admitting: Cardiology

## 2016-09-01 DIAGNOSIS — R9439 Abnormal result of other cardiovascular function study: Secondary | ICD-10-CM | POA: Insufficient documentation

## 2016-09-01 DIAGNOSIS — R918 Other nonspecific abnormal finding of lung field: Secondary | ICD-10-CM | POA: Insufficient documentation

## 2016-09-01 DIAGNOSIS — K769 Liver disease, unspecified: Secondary | ICD-10-CM | POA: Diagnosis not present

## 2016-09-01 MED ORDER — METOPROLOL TARTRATE 5 MG/5ML IV SOLN
5.0000 mg | INTRAVENOUS | Status: DC | PRN
  Administered 2016-09-01 (×3): 5 mg via INTRAVENOUS
  Filled 2016-09-01: qty 5

## 2016-09-01 MED ORDER — IOPAMIDOL (ISOVUE-370) INJECTION 76%
INTRAVENOUS | Status: AC
  Administered 2016-09-01: 80 mL
  Filled 2016-09-01: qty 100

## 2016-09-01 MED ORDER — NITROGLYCERIN 0.4 MG SL SUBL
0.4000 mg | SUBLINGUAL_TABLET | SUBLINGUAL | Status: DC | PRN
  Administered 2016-09-01: 0.4 mg via SUBLINGUAL
  Filled 2016-09-01: qty 25

## 2016-09-01 MED ORDER — METOPROLOL TARTRATE 5 MG/5ML IV SOLN
INTRAVENOUS | Status: AC
  Administered 2016-09-01: 5 mg via INTRAVENOUS
  Filled 2016-09-01: qty 20

## 2016-09-01 MED ORDER — NITROGLYCERIN 0.4 MG SL SUBL
SUBLINGUAL_TABLET | SUBLINGUAL | Status: AC
  Filled 2016-09-01: qty 1

## 2016-09-08 ENCOUNTER — Telehealth: Payer: Self-pay | Admitting: Primary Care

## 2016-09-08 NOTE — Telephone Encounter (Signed)
Outreach Attempt # 1 - Called and left voicemail stating that pt has not been seen since 2012.  Provided phone number for pt to schedule appointment.

## 2016-09-09 NOTE — Telephone Encounter (Signed)
Patient called and scheduled appt for 10/07/16.

## 2016-09-15 ENCOUNTER — Telehealth: Payer: Self-pay | Admitting: Primary Care

## 2016-09-15 ENCOUNTER — Other Ambulatory Visit
Admission: RE | Admit: 2016-09-15 | Discharge: 2016-09-15 | Disposition: A | Discharge status: Home / Self Care | Payer: Self-pay | Source: Ambulatory Visit | Attending: Primary Care | Admitting: Primary Care

## 2016-09-15 DIAGNOSIS — Z1321 Encounter for screening for nutritional disorder: Secondary | ICD-10-CM

## 2016-09-15 DIAGNOSIS — Z13228 Encounter for screening for other metabolic disorders: Secondary | ICD-10-CM

## 2016-09-15 DIAGNOSIS — Z130 Encounter for screening for diseases of the blood and blood-forming organs and certain disorders involving the immune mechanism: Secondary | ICD-10-CM

## 2016-09-15 DIAGNOSIS — Z1329 Encounter for screening for other suspected endocrine disorder: Secondary | ICD-10-CM

## 2016-09-15 LAB — COMPREHENSIVE METABOLIC PANEL
ALT: 26 U/L (ref 0–35)
AST: 30 U/L (ref 0–35)
Albumin: 4.7 g/dL (ref 3.5–5.2)
Alk Phos: 74 U/L (ref 35–105)
Anion Gap: 22 — ABNORMAL HIGH (ref 7–16)
Bilirubin,Total: 1.7 mg/dL — ABNORMAL HIGH (ref 0.0–1.2)
CO2: 20 mmol/L (ref 20–28)
Calcium: 9.9 mg/dL (ref 8.6–10.2)
Chloride: 101 mmol/L (ref 96–108)
Creatinine: 0.81 mg/dL (ref 0.51–0.95)
GFR,Black: 96 *
GFR,Caucasian: 83 *
Glucose: 91 mg/dL (ref 60–99)
Lab: 16 mg/dL (ref 6–20)
Potassium: 4.5 mmol/L (ref 3.3–5.1)
Sodium: 143 mmol/L (ref 133–145)
Total Protein: 7.4 g/dL (ref 6.3–7.7)

## 2016-09-15 LAB — LIPID PANEL
Chol/HDL Ratio: 2.2
Cholesterol: 166 mg/dL
HDL: 74 mg/dL
LDL Calculated: 80 mg/dL
Non HDL Cholesterol: 92 mg/dL
Triglycerides: 60 mg/dL

## 2016-09-15 LAB — TSH: TSH: 1.37 u[IU]/mL (ref 0.27–4.20)

## 2016-09-15 NOTE — Telephone Encounter (Signed)
Call from [facility], patient was at the lab this morning, please extend the old lab orders so that they can use them. Thanks

## 2016-09-15 NOTE — Telephone Encounter (Signed)
All set!

## 2016-09-16 LAB — HEMOGLOBIN A1C: Hemoglobin A1C: 5.3 % (ref 4.0–6.0)

## 2016-10-07 ENCOUNTER — Ambulatory Visit: Payer: Self-pay | Admitting: Primary Care

## 2016-10-07 ENCOUNTER — Encounter: Payer: Self-pay | Admitting: Primary Care

## 2016-10-07 VITALS — BP 110/70 | HR 64 | Ht 62.01 in | Wt 144.6 lb

## 2016-10-07 DIAGNOSIS — Z1211 Encounter for screening for malignant neoplasm of colon: Secondary | ICD-10-CM

## 2016-10-07 DIAGNOSIS — M2170 Unequal limb length (acquired), unspecified site: Secondary | ICD-10-CM

## 2016-10-07 DIAGNOSIS — M25552 Pain in left hip: Secondary | ICD-10-CM

## 2016-10-07 DIAGNOSIS — M25551 Pain in right hip: Secondary | ICD-10-CM

## 2016-10-07 DIAGNOSIS — Z1239 Encounter for other screening for malignant neoplasm of breast: Secondary | ICD-10-CM

## 2016-10-07 DIAGNOSIS — Z23 Encounter for immunization: Secondary | ICD-10-CM

## 2016-10-07 NOTE — Patient Instructions (Addendum)
Let me know if fatigue or darker urine or yellow eyes emerge.  Let me know if losing appetite or things taste "off".  If you can find out from blood relative whether they have ever been told of elevated bilirubin, let me know about that.  Will plan on updated mammogram.  Will plan on colon screening with colonoscopy.  Podiatry referral entered to assess the hip pain and possible leg length discrepancy.  Keep up the vitamin with D in it.  Be sure to get 7+ hours of sleep nightly.

## 2016-10-08 NOTE — Progress Notes (Signed)
Subjective:     Patient ID:   Carrie Munoz is a 50 y.o. female    HPI  Here for f/u of all:    Migraine -- infrequent, aware of headache hygiene, where use of triptan, effective if needed.    MSK -- notes lateral "hip" pain/tenderness that makes sleeping on side painful.  H/o L tib/fib fracture with associated leg length issues, related?  Daughter notes that Carrie Munoz's L shoe wears very differently from Carrie Munoz -- has not pursued colon screening, and overdue for mammogram.    Patient's medications, allergies, past medical, surgical, social and family histories were reviewed and updated as appropriate.  Nonsmoker.    ROS  No GI or GU concerns, and no exertional sx of concern.    Objective:    Physical Exam   Constitutional: She is well-developed, well-nourished, and in no distress.   Eyes: Conjunctivae are normal. No scleral icterus.   Neck: No thyromegaly present.   Cardiovascular: Normal rate and regular rhythm.    Pulmonary/Chest: Effort normal. No respiratory distress.   Musculoskeletal: She exhibits no edema.   Neurological: She is alert.   Skin: Skin is dry. She is not diaphoretic.   Psychiatric: Affect normal.         Component      Latest Ref Rng & Units 09/15/2016   Sodium      133 - 145 mmol/L 143   Potassium      3.3 - 5.1 mmol/L 4.5   Chloride      96 - 108 mmol/L 101   CO2      20 - 28 mmol/L 20   Anion Gap      7 - 16 22 (H)   UN      6 - 20 mg/dL 16   Creatinine      0.51 - 0.95 mg/dL 0.81   GFR,Caucasian      * 83   GFR,Black      * 96   Glucose      60 - 99 mg/dL 91   Calcium      8.6 - 10.2 mg/dL 9.9   Total Protein      6.3 - 7.7 g/dL 7.4   Albumin      3.5 - 5.2 g/dL 4.7   Bilirubin,Total      0.0 - 1.2 mg/dL 1.7 (H)   AST      0 - 35 U/L 30   ALT      0 - 35 U/L 26   Alk Phos      35 - 105 U/L 74   Cholesterol      mg/dL 166   Triglycerides      mg/dL 60   HDL      mg/dL 74   LDL Calculated      mg/dL 80   Non HDL Cholesterol      mg/dL 92   Chol/HDL Ratio       2.2   TSH      0.27 - 4.20 uIU/mL  1.37   Hemoglobin A1C      4.0 - 6.0 % 5.3     Assessment:      Discussed isolated hyperbilirubinemia with no other manifestation of bile stasis or hemolysis, no ongoing exposures to hepatotoxins.  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    Headache hygiene reviewed.    Healthy habits reinforced.  Reviewed CV risk profile, opportunities for risk reduction.  Discussed CA screening options, targets for BMD maintenance and surveillance.  Discussed immunization schedule and flu shot given today.    Plan:      Patient Instructions   Let me know if fatigue or darker urine or yellow eyes emerge.  Let me know if losing appetite or things taste "off".  If you can find out from blood relative whether they have ever been told of elevated bilirubin, let me know about that.  Will plan on updated mammogram.  Will plan on colon screening with colonoscopy.  Podiatry referral entered to assess the hip pain and possible leg length discrepancy.  Keep up the vitamin with D in it.  Be sure to get 7+ hours of sleep nightly.

## 2016-10-09 ENCOUNTER — Encounter: Payer: Self-pay | Admitting: Primary Care

## 2016-10-21 ENCOUNTER — Ambulatory Visit: Payer: Self-pay | Admitting: Primary Care

## 2016-11-26 ENCOUNTER — Other Ambulatory Visit: Payer: Self-pay | Admitting: Obstetrics and Gynecology

## 2016-12-15 LAB — GYN CYTOLOGY

## 2016-12-20 DIAGNOSIS — R7989 Other specified abnormal findings of blood chemistry: Secondary | ICD-10-CM

## 2016-12-20 HISTORY — DX: Other specified abnormal findings of blood chemistry: R79.89

## 2017-01-26 ENCOUNTER — Other Ambulatory Visit: Payer: Self-pay | Admitting: Obstetrics and Gynecology

## 2017-01-26 DIAGNOSIS — Z1231 Encounter for screening mammogram for malignant neoplasm of breast: Secondary | ICD-10-CM

## 2017-02-07 ENCOUNTER — Other Ambulatory Visit: Payer: Self-pay | Admitting: Gastroenterology

## 2017-02-07 LAB — HM COLONOSCOPY

## 2017-02-07 NOTE — Procedures (Signed)
[facility]  Colonoscopy Procedure Note     Date of Procedure: 02/07/2017   Referring Physician: Ruby ColaBudd, Salamatof L, MD   Primary Physician: Ruby ColaBudd,  L, MD  Attending Physician: Cline CoolsAnil K Lyan Holck MD  Indications:   Colorectal cancer screening-average risk    Pre-Endoscopic evaluation: The indications, risks, benefits, of the planned procedure and moderate sedation have been explained to the patient and/or family and for/or proxy.  I have reviewed the patient's database and reassessed the patient immediately prior to the procedure.  No changes were noted from the earlier pre-operative evaluation.  The patient was deemed appropriate and stable to undergo the planned procedure.  Vital Signs Stable per Nursing notes  Airway Normal - Heart Sounds normal- Lungs Clear  ASA Class 2    Medications:Fentanyl 100 mcg IV and Midazolam 4 mg IV were administered incrementally over the course of the procedure to achieve an adequate level of conscious sedation.     Procedure Details: The patient was placed in the left lateral decubitus position and monitored continuously with ECG tracing, pulse oximetry, blood pressure monitoring and direct observations. After anorectal examination was performed, the Olympus Video Colonoscope was inserted into the rectum and advanced under direct vision to the terminal ileum. The procedure was considered not difficult..    Narrative:  During withdrawal examination, the final quality of the prep was good  A careful inspection was made as the colonoscope was withdrawn, a retroflexed view of the rectum was included; findings and interventions are described below.The patient recovered satisfactorily  in the [facility] recovery area.      The cecum, terminal ileum, ascending colon, transverse colon, descending colon, rectosigmoid revealed no evidence of mass, polyp or inflammatory disease.    Additional Interventions:   None    Complications: none    Impression:   Normal colonoscopy with no evidence  of mass, polyp or inflammatory changes    Recommendations:   Recommend stool FIT in 3 years, if negative repeat colonoscopy in 10 years    Lisbeth PlyANIL Derica Leiber, MD

## 2017-02-08 ENCOUNTER — Encounter: Payer: Self-pay | Admitting: Primary Care

## 2017-02-23 ENCOUNTER — Ambulatory Visit
Admission: RE | Admit: 2017-02-23 | Discharge: 2017-02-23 | Disposition: A | Discharge status: Home / Self Care | Payer: Managed Care, Other (non HMO) | Source: Ambulatory Visit | Attending: Obstetrics and Gynecology | Admitting: Obstetrics and Gynecology

## 2017-02-23 DIAGNOSIS — Z1231 Encounter for screening mammogram for malignant neoplasm of breast: Secondary | ICD-10-CM

## 2017-03-02 ENCOUNTER — Telehealth: Payer: Self-pay | Admitting: Primary Care

## 2017-03-02 NOTE — Telephone Encounter (Signed)
Review of "Order Information" basket: 10/07/16 - Pt will schedule with [facility] / order has been faxed     No appt record found yet. Mammography letter getting sent out 03/03/17 to remind patient.

## 2017-03-21 ENCOUNTER — Telehealth: Payer: Self-pay | Admitting: Obstetrics and Gynecology

## 2017-03-21 NOTE — Telephone Encounter (Signed)
Left message with patient's son to call regarding upcoming appointment has been canceled and needs to be rescheduled.

## 2017-04-01 ENCOUNTER — Ambulatory Visit: Payer: 59 | Admitting: Obstetrics and Gynecology

## 2017-04-04 ENCOUNTER — Ambulatory Visit: Payer: 59 | Admitting: Obstetrics and Gynecology

## 2017-04-06 NOTE — Progress Notes (Signed)
51 y.o. G68P2002 Married Caucasian female here for annual exam.    Having acne issues.  Not really having hot flashes.  Some night sweats.  Feeling like she is gaining weight.   Broken toe and broken tailbone due to a stubbed toe and falling down stairs.   Daughter dx with pancreatic cancer months after giving birth to twins.   She had surgery and is doing well. Another daughter married.  PCP:  None   Patient's last menstrual period was 10/14/2014 (approximate).           Sexually active: Yes.   female The current method of family planning is status post hysterectomy.    Exercising: No.   Smoker:  no  Health Maintenance: Pap: 02-20-14 Neg:Neg HR HPV; 12-23-10 Neg History of abnormal Pap:  no MMG: 02-23-17 Density B/Neg/BiRads1:TBC.   Colonoscopy:  NEVER.  Not interested in doing a prep. BMD:   n/a  Result  n/a  TDaP:  08/2011 Gardasil:   no HIV:  In pregnancy. Hep C:  Today.  Screening Labs:  Urine today: not done   reports that she has never smoked. She has never used smokeless tobacco. She reports that she does not drink alcohol or use drugs.  Past Medical History:  Diagnosis Date  . SVD (spontaneous vaginal delivery)    x 2  . Wears glasses     Past Surgical History:  Procedure Laterality Date  . CYSTOSCOPY N/A 11/05/2014   Procedure: CYSTOSCOPY;  Surgeon: Jamey Reas de Berton Lan, MD;  Location: [facility];  Service: Gynecology;  Laterality: N/A;  . EYE SURGERY  1999   Bilateral Lasik  . GYNECOLOGIC CRYOSURGERY     d/t heavy vaginal discharge  . MASS EXCISION Right 03/21/2014   Procedure: WIDE EXCISION MELANOMA RIGHT LEG;  Surgeon: Imogene Burn. Georgette Dover, MD;  Location: [place];  Service: General;  Laterality: Right;  . ROBOTIC ASSISTED TOTAL HYSTERECTOMY Bilateral 11/05/2014   Procedure: ROBOTIC ASSISTED TOTAL HYSTERECTOMY WITH BILATERAL SALPINGECTOMY ;  Surgeon: Everardo All Amundson de Berton Lan, MD;  Location: [facility];  Service: Gynecology;   Laterality: Bilateral;  . SHOULDER SURGERY Left 10/2015   torn Lt.labrum and bone spurs  . TONSILLECTOMY AND ADENOIDECTOMY    . WISDOM TOOTH EXTRACTION      No current outpatient prescriptions on file.   No current facility-administered medications for this visit.     Family History  Problem Relation Age of Onset  . Hypertension Father   . Heart attack Father 31    bypass surgery  . Cancer Mother 84    Dec--bone CA  . Cancer Daughter 24    pancreatic cancer    ROS:  Pertinent items are noted in HPI.  Otherwise, a comprehensive ROS was negative.  Exam:   BP 134/78 (BP Location: Right Arm, Patient Position: Sitting, Cuff Size: Normal)   Pulse 76   Resp 14   Ht 5\' 5"  (1.651 m)   Wt 141 lb (64 kg)   LMP 10/14/2014 (Approximate)   BMI 23.46 kg/m     General appearance: alert, cooperative and appears stated age Head: Normocephalic, without obvious abnormality, atraumatic Neck: no adenopathy, supple, symmetrical, trachea midline and thyroid normal to inspection and palpation Lungs: clear to auscultation bilaterally Breasts: normal appearance, no masses or tenderness, No nipple retraction or dimpling, No nipple discharge or bleeding, No axillary or supraclavicular adenopathy Heart: regular rate and rhythm Abdomen: soft, non-tender; no masses, no organomegaly Extremities: extremities  normal, atraumatic, no cyanosis or edema Skin: Skin color, texture, turgor normal. No rashes or lesions Lymph nodes: Cervical, supraclavicular, and axillary nodes normal. No abnormal inguinal nodes palpated Neurologic: Grossly normal  Pelvic: External genitalia:  no lesions              Urethra:  normal appearing urethra with no masses, tenderness or lesions              Bartholins and Skenes: normal                 Vagina: normal appearing vagina with normal color and discharge, no lesions              Cervix:  Absent.              Pap taken: No. Bimanual Exam:  Uterus:   Absent.                Adnexa: no mass, fullness, tenderness              Rectal exam: Yes.  .  Confirms.              Anus:  normal sphincter tone, no lesions  Chaperone was present for exam.  Assessment:   Well woman visit with normal exam. Status post robotic TLH and bilateral salpingectomy.   Plan: Mammogram screening discussed. Recommended self breast awareness. Pap and HR HPV as above. Guidelines for Calcium, Vitamin D, regular exercise program including cardiovascular and weight bearing exercise. IFOB.  I discussed Cologuard. Routine labs.  Follow up annually and prn.   After visit summary provided.

## 2017-04-08 ENCOUNTER — Ambulatory Visit (INDEPENDENT_AMBULATORY_CARE_PROVIDER_SITE_OTHER): Payer: Managed Care, Other (non HMO) | Admitting: Obstetrics and Gynecology

## 2017-04-08 ENCOUNTER — Encounter: Payer: Self-pay | Admitting: Obstetrics and Gynecology

## 2017-04-08 VITALS — BP 134/78 | HR 76 | Resp 14 | Ht 65.0 in | Wt 141.0 lb

## 2017-04-08 DIAGNOSIS — Z01419 Encounter for gynecological examination (general) (routine) without abnormal findings: Secondary | ICD-10-CM | POA: Diagnosis not present

## 2017-04-08 DIAGNOSIS — Z1211 Encounter for screening for malignant neoplasm of colon: Secondary | ICD-10-CM

## 2017-04-08 NOTE — Addendum Note (Signed)
Addended by: Lowella Fairy on: 04/08/2017 02:17 PM   Modules accepted: Orders

## 2017-04-08 NOTE — Patient Instructions (Signed)

## 2017-04-11 ENCOUNTER — Other Ambulatory Visit (INDEPENDENT_AMBULATORY_CARE_PROVIDER_SITE_OTHER): Payer: Managed Care, Other (non HMO)

## 2017-04-11 DIAGNOSIS — Z0000 Encounter for general adult medical examination without abnormal findings: Secondary | ICD-10-CM

## 2017-04-11 DIAGNOSIS — Z01419 Encounter for gynecological examination (general) (routine) without abnormal findings: Secondary | ICD-10-CM

## 2017-04-11 LAB — CBC
HCT: 41 % (ref 35.0–45.0)
Hemoglobin: 13.1 g/dL (ref 11.7–15.5)
MCH: 27.8 pg (ref 27.0–33.0)
MCHC: 32 g/dL (ref 32.0–36.0)
MCV: 86.9 fL (ref 80.0–100.0)
MPV: 10.5 fL (ref 7.5–12.5)
PLATELETS: 348 10*3/uL (ref 140–400)
RBC: 4.72 MIL/uL (ref 3.80–5.10)
RDW: 13.7 % (ref 11.0–15.0)
WBC: 6.2 10*3/uL (ref 3.8–10.8)

## 2017-04-11 LAB — TSH: TSH: 2.97 m[IU]/L

## 2017-04-11 LAB — COMPREHENSIVE METABOLIC PANEL
ALK PHOS: 59 U/L (ref 33–130)
ALT: 10 U/L (ref 6–29)
AST: 13 U/L (ref 10–35)
Albumin: 4.3 g/dL (ref 3.6–5.1)
BILIRUBIN TOTAL: 0.4 mg/dL (ref 0.2–1.2)
BUN: 14 mg/dL (ref 7–25)
CALCIUM: 9.3 mg/dL (ref 8.6–10.4)
CO2: 22 mmol/L (ref 20–31)
CREATININE: 0.68 mg/dL (ref 0.50–1.05)
Chloride: 105 mmol/L (ref 98–110)
GLUCOSE: 78 mg/dL (ref 65–99)
Potassium: 4.1 mmol/L (ref 3.5–5.3)
SODIUM: 138 mmol/L (ref 135–146)
Total Protein: 6.8 g/dL (ref 6.1–8.1)

## 2017-04-11 LAB — LIPID PANEL
CHOLESTEROL: 169 mg/dL (ref <200)
HDL: 62 mg/dL (ref >50)
LDL Cholesterol: 93 mg/dL (ref <100)
Total CHOL/HDL Ratio: 2.7 Ratio (ref <5.0)
Triglycerides: 70 mg/dL (ref <150)
VLDL: 14 mg/dL (ref <30)

## 2017-04-12 ENCOUNTER — Encounter: Payer: Self-pay | Admitting: Obstetrics and Gynecology

## 2017-04-12 LAB — VITAMIN D 25 HYDROXY (VIT D DEFICIENCY, FRACTURES): Vit D, 25-Hydroxy: 24 ng/mL — ABNORMAL LOW (ref 30–100)

## 2017-04-13 LAB — FECAL OCCULT BLOOD, IMMUNOCHEMICAL: IFOBT: NEGATIVE

## 2017-12-15 ENCOUNTER — Encounter: Payer: Self-pay | Admitting: Primary Care

## 2017-12-15 ENCOUNTER — Ambulatory Visit: Payer: BLUE CROSS/BLUE SHIELD | Attending: Primary Care | Admitting: Primary Care

## 2017-12-15 VITALS — BP 122/68 | HR 68 | Ht 62.0 in | Wt 148.6 lb

## 2017-12-15 DIAGNOSIS — B351 Tinea unguium: Secondary | ICD-10-CM

## 2017-12-15 DIAGNOSIS — Z1239 Encounter for other screening for malignant neoplasm of breast: Secondary | ICD-10-CM

## 2017-12-15 DIAGNOSIS — Z1231 Encounter for screening mammogram for malignant neoplasm of breast: Secondary | ICD-10-CM

## 2017-12-15 MED ORDER — TERBINAFINE HCL 250 MG PO TABS *I*
250.0000 mg | ORAL_TABLET | Freq: Every day | ORAL | 0 refills | Status: DC
Start: 2017-12-15 — End: 2019-04-23

## 2017-12-15 NOTE — Progress Notes (Signed)
Subjective:     Patient ID:   Carrie MuscaDiana Munoz is a 51 y.o. female    HPI  Previous athlete's foot, "from someone's yoga mat".    Now with minimal scale on feet, asymptomatic, but debris and discoloration under multiple nails, seemingly spreading, both feet.  Weeks/month(s).    No new systemic symptoms.  Never followed through with her mammogram.  Three first-degree relatives with cancer, two prostate, one breast.    Patient's medications, allergies, past medical, surgical, social and family histories were reviewed and updated as appropriate.  Nonsmoker.      ROS  No new thirst or weight loss.    Objective:    Physical Exam  Typical onychomycotic look to multiple toenails, bilat -- corners more than central, distal more than proximal, brown/yellow, subungual debris.  Minimal onycholysis.      Assessment:      Onychomycosis is likely.  Discussed options, wants to try systemic terbinafine after discussion.  Will try to protect against recurrence once clears.       Plan:      Baseline liver profile, repeat one month in.  Terbinafine 250 mg daily for three months.  Protect feet/nails from re-exposure as able, and consider intermittent topical antifungal as maintenance?    Follow through with mammogram, and [facility]?

## 2017-12-16 ENCOUNTER — Telehealth: Payer: Self-pay | Admitting: Primary Care

## 2017-12-16 ENCOUNTER — Other Ambulatory Visit
Admission: RE | Admit: 2017-12-16 | Discharge: 2017-12-16 | Disposition: A | Discharge status: Home / Self Care | Payer: BLUE CROSS/BLUE SHIELD | Source: Ambulatory Visit | Attending: Primary Care | Admitting: Primary Care

## 2017-12-16 DIAGNOSIS — B351 Tinea unguium: Secondary | ICD-10-CM | POA: Insufficient documentation

## 2017-12-16 LAB — HEPATIC FUNCTION PANEL
ALT: 20 U/L (ref 0–35)
AST: 21 U/L (ref 0–35)
Albumin: 4.4 g/dL (ref 3.5–5.2)
Alk Phos: 70 U/L (ref 35–105)
Bili,Indirect: 1.1 mg/dL — ABNORMAL HIGH (ref 0.1–1.0)
Bilirubin,Direct: 0.4 mg/dL — ABNORMAL HIGH (ref 0.0–0.3)
Bilirubin,Total: 1.5 mg/dL — ABNORMAL HIGH (ref 0.0–1.2)
Total Protein: 6.8 g/dL (ref 6.3–7.7)

## 2017-12-16 NOTE — Telephone Encounter (Signed)
I called Carrie Munoz and left message on personal voicemail with this information.  Asked her to return my call with any questions

## 2017-12-16 NOTE — Telephone Encounter (Signed)
Baseline liver enzymes okay; start terbinafine and repeat in a month.  Thanks Elon JesterMichele.

## 2018-04-21 ENCOUNTER — Ambulatory Visit: Payer: Managed Care, Other (non HMO) | Admitting: Obstetrics and Gynecology

## 2018-04-25 IMAGING — CT CT HEART SCORING
2 series · 16 of 20 positions shown, 18 images · non-contrast
Comparison: None.

CLINICAL DATA: Risk stratification

EXAM:
Coronary Calcium Score
TECHNIQUE: The patient was scanned on a Siemens Sensation 16 slice scanner.
Axial non-contrast 3mm slices were carried out through the heart.
The data set was analyzed on a dedicated work station and scored
using the Agatson method.

[Series 2: casc 3.0 i36f 2 bestdiast 76 % · axial · 0.30mm/px · z∈[-240,-135]mm · 8 of 45 slices shown, 10 images]
[im 5/45  vessel]
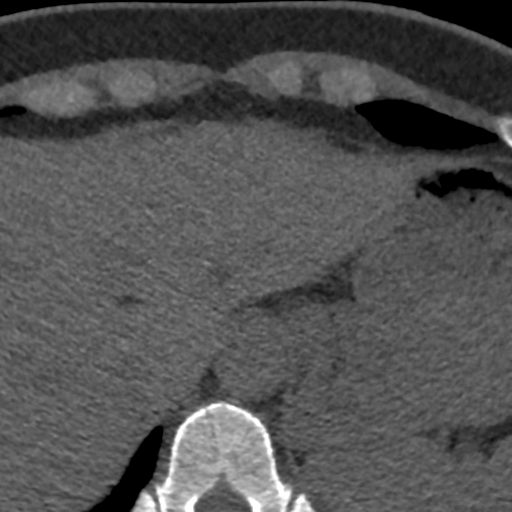
[im 5/45  lung]
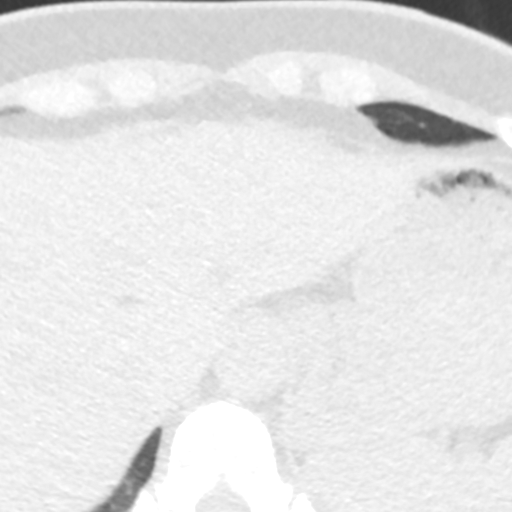
[im 10/45  vessel]
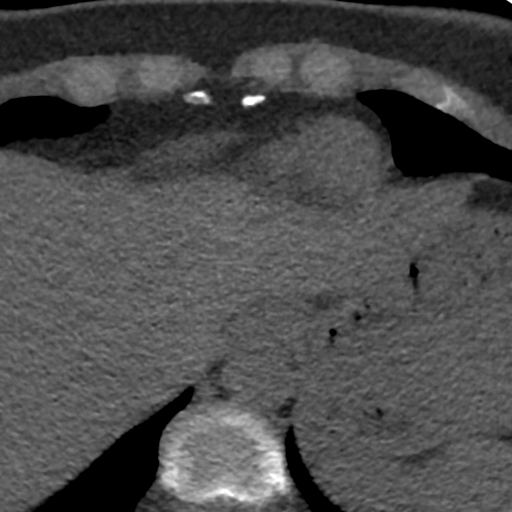
[im 15/45  vessel]
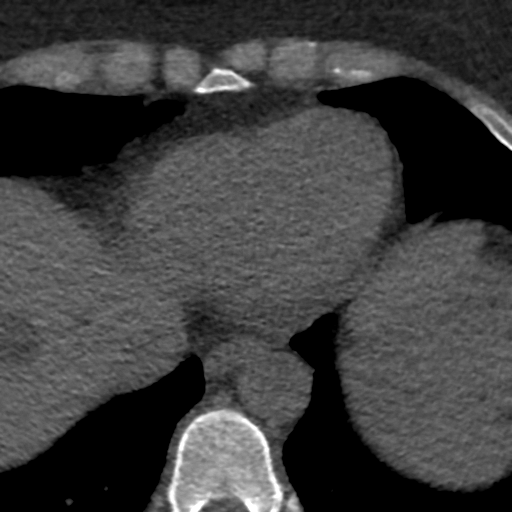
[im 20/45  vessel]
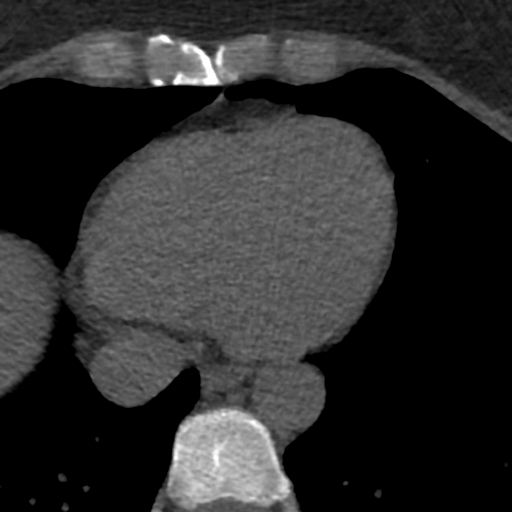
[im 25/45  vessel]
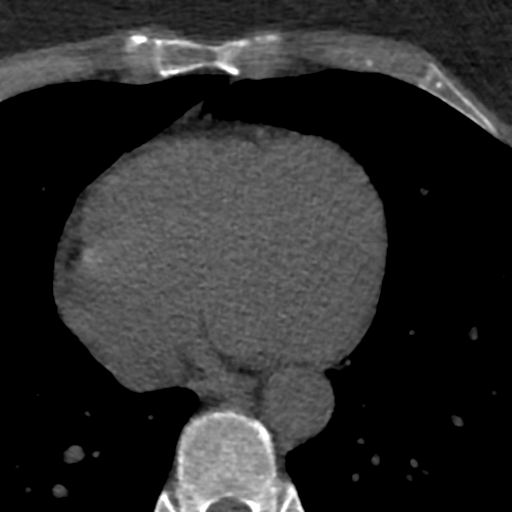
[im 25/45  lung]
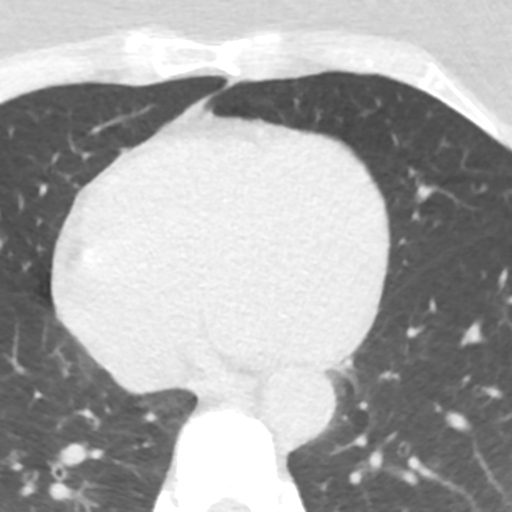
[im 30/45  vessel]
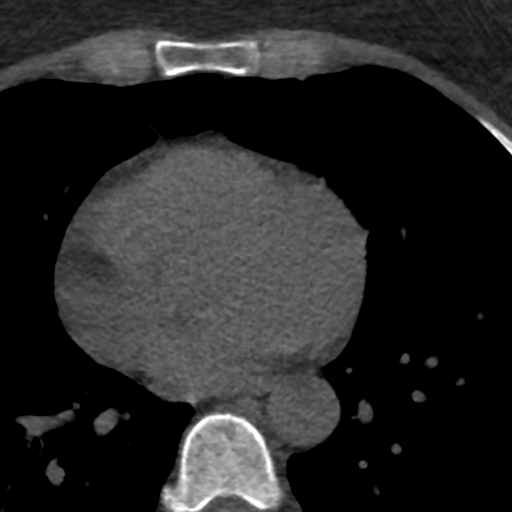
[im 35/45  vessel]
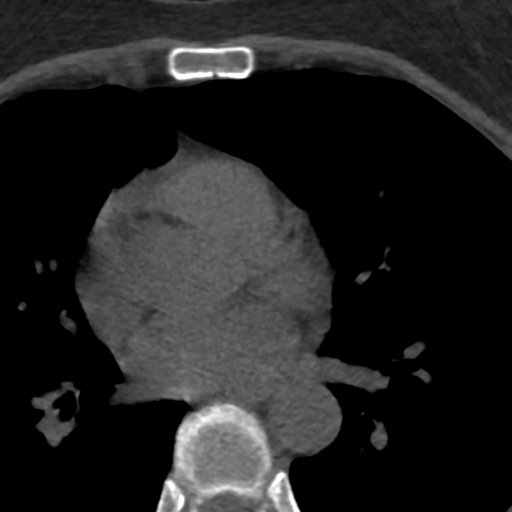
[im 40/45  vessel]
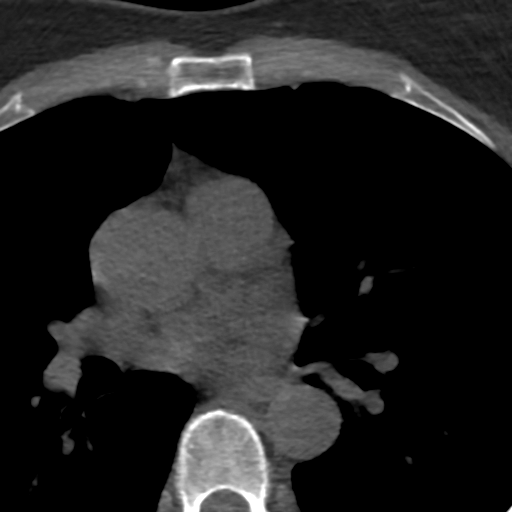

[Series 4: lung st 71 % · axial · 0.62mm/px · z∈[-240,-134]mm · 8 of 45 slices shown]
[im 5/45  lung]
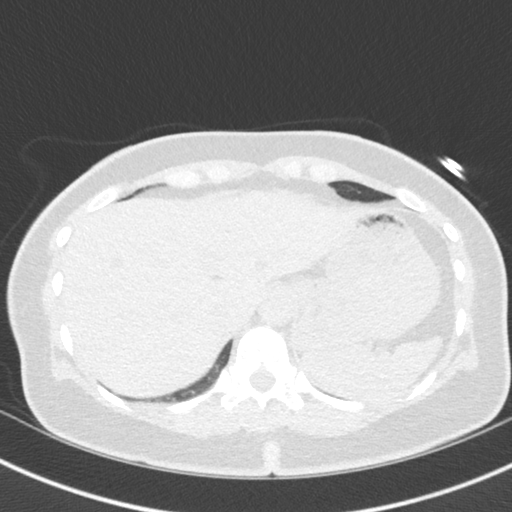
[im 10/45  lung]
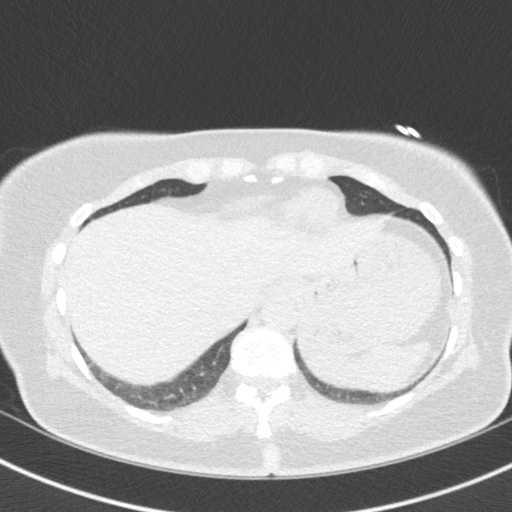
[im 15/45  lung]
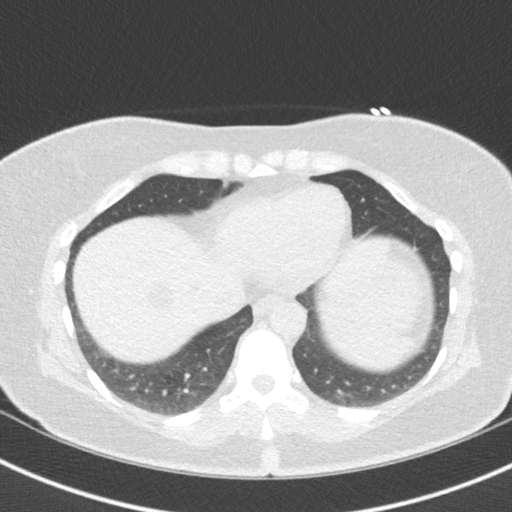
[im 20/45  lung]
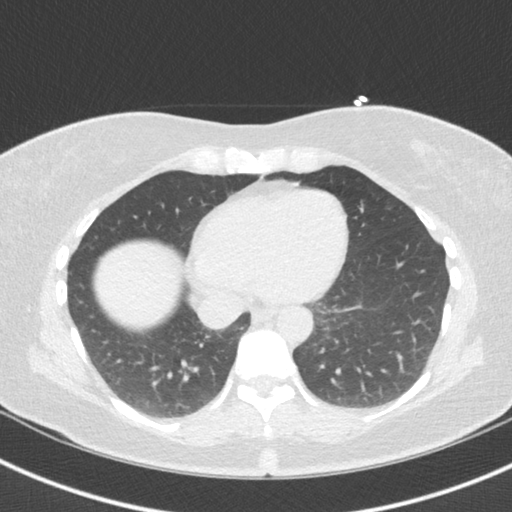
[im 25/45  lung]
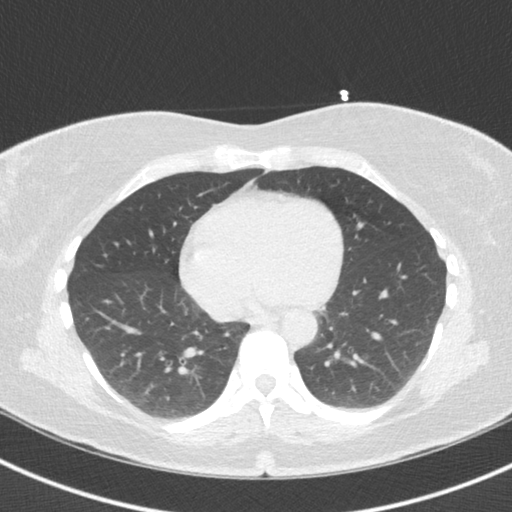
[im 30/45  lung]
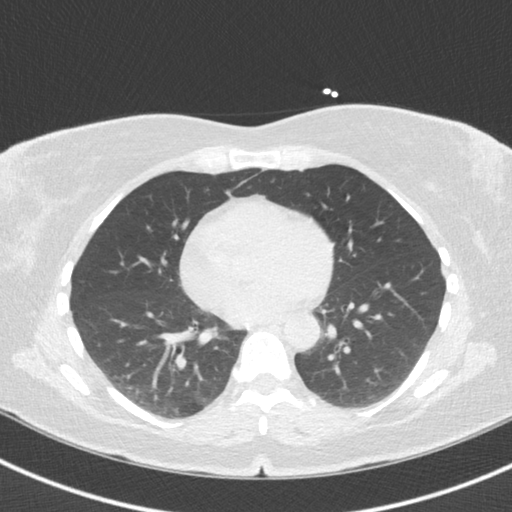
[im 35/45  lung]
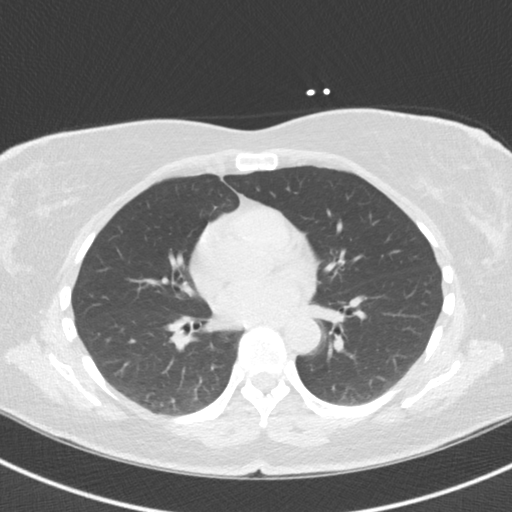
[im 40/45  lung]
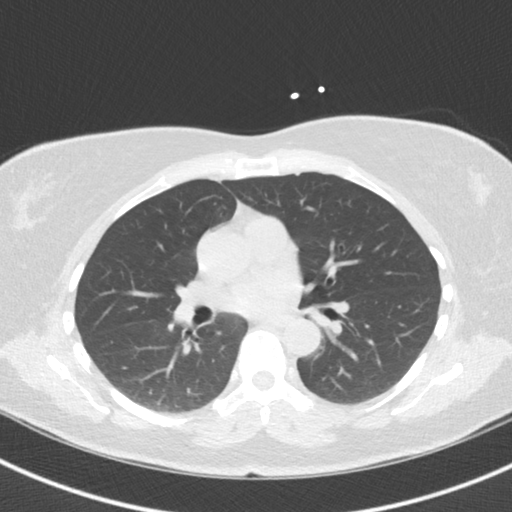

[16 of 20 positions shown; findings below may reference images not displayed]

FINDINGS: Non-cardiac: No significant non cardiac findings on limited lung and
soft tissue windows. See separate report from [REDACTED].

Ascending Aorta:  3.3 with some atherosclerotic changes

Pericardium: Normal

Coronary arteries:  Sporadic calcification seen in the RCA
IMPRESSION: Coronary calcium score of 24. This was 90th percentile for age and
sex matched control. Some calcification and atherosclerosis also
seen in the aortic Shaniqua

Nilimar Tirtho

EXAM:
OVER-READ INTERPRETATION  CT CHEST

The following report is an over-read performed by radiologist Dr. GM
Amemiya [REDACTED] on 08/11/2016. This over-read
does not include interpretation of cardiac or coronary anatomy or
pathology. The Interpretation by the cardiologist is attached.
FINDINGS: No mediastinal or hilar mass or adenopathy is identified in the
visualized portion of the chest. The visualized esophagus is grossly
normal.

The visualized lungs are clear. No worrisome pulmonary lesions or
acute pulmonary findings. Few tiny subpleural pulmonary nodules are
likely lymph nodes.

A simple appearing hepatic cyst is noted at the dome. Other smaller
cysts are noted.

The visualized osseous structures are unremarkable.
IMPRESSION: No significant findings in the mediastinum or lungs.

## 2018-05-22 ENCOUNTER — Telehealth: Payer: Self-pay | Admitting: Obstetrics and Gynecology

## 2018-05-22 ENCOUNTER — Ambulatory Visit: Payer: Self-pay | Admitting: Obstetrics and Gynecology

## 2018-05-22 NOTE — Progress Notes (Deleted)
52 y.o. G8P2002 Married Caucasian female here for annual exam.    PCP:     Patient's last menstrual period was 10/14/2014 (approximate).           Sexually active: {yes no:314532}  The current method of family planning is status post hysterectomy.    Exercising: {yes no:314532}  {types:19826} Smoker:  no  Health Maintenance: Pap:  02-20-14 Neg:Neg HR HPV; 12-23-10 Neg History of abnormal Pap:  no MMG:  02/23/17 BIRADS 1 negative/density b Colonoscopy:  *** BMD:   n/a  Result  n/a TDaP:  08/2011 Gardasil:   n/a HIV: Hep C: Screening Labs:  Hb today: ***, Urine today: ***   reports that she has never smoked. She has never used smokeless tobacco. She reports that she does not drink alcohol or use drugs.  Past Medical History:  Diagnosis Date  . Low vitamin D level 2018  . SVD (spontaneous vaginal delivery)    x 2  . Wears glasses     Past Surgical History:  Procedure Laterality Date  . CYSTOSCOPY N/A 11/05/2014   Procedure: CYSTOSCOPY;  Surgeon: Jamey Reas de Berton Lan, MD;  Location: [facility];  Service: Gynecology;  Laterality: N/A;  . EYE SURGERY  1999   Bilateral Lasik  . GYNECOLOGIC CRYOSURGERY     d/t heavy vaginal discharge  . MASS EXCISION Right 03/21/2014   Procedure: WIDE EXCISION MELANOMA RIGHT LEG;  Surgeon: Imogene Burn. Georgette Dover, MD;  Location: [facility];  Service: General;  Laterality: Right;  . ROBOTIC ASSISTED TOTAL HYSTERECTOMY Bilateral 11/05/2014   Procedure: ROBOTIC ASSISTED TOTAL HYSTERECTOMY WITH BILATERAL SALPINGECTOMY ;  Surgeon: Everardo All Amundson de Berton Lan, MD;  Location: [facility];  Service: Gynecology;  Laterality: Bilateral;  . SHOULDER SURGERY Left 10/2015   torn Lt.labrum and bone spurs  . TONSILLECTOMY AND ADENOIDECTOMY    . WISDOM TOOTH EXTRACTION      No current outpatient medications on file.   No current facility-administered medications for this visit.     Family History  Problem Relation Age of Onset  .  Hypertension Father   . Heart attack Father 66       bypass surgery  . Cancer Mother 22       Dec--bone CA  . Cancer Daughter 24       pancreatic cancer    Review of Systems  Exam:   LMP 10/14/2014 (Approximate)     General appearance: alert, cooperative and appears stated age Head: Normocephalic, without obvious abnormality, atraumatic Neck: no adenopathy, supple, symmetrical, trachea midline and thyroid normal to inspection and palpation Lungs: clear to auscultation bilaterally Breasts: normal appearance, no masses or tenderness, No nipple retraction or dimpling, No nipple discharge or bleeding, No axillary or supraclavicular adenopathy Heart: regular rate and rhythm Abdomen: soft, non-tender; no masses, no organomegaly Extremities: extremities normal, atraumatic, no cyanosis or edema Skin: Skin color, texture, turgor normal. No rashes or lesions Lymph nodes: Cervical, supraclavicular, and axillary nodes normal. No abnormal inguinal nodes palpated Neurologic: Grossly normal  Pelvic: External genitalia:  no lesions              Urethra:  normal appearing urethra with no masses, tenderness or lesions              Bartholins and Skenes: normal                 Vagina: normal appearing vagina with normal color and discharge, no lesions  Cervix: no lesions              Pap taken: {yes no:314532} Bimanual Exam:  Uterus:  normal size, contour, position, consistency, mobility, non-tender              Adnexa: no mass, fullness, tenderness              Rectal exam: {yes no:314532}.  Confirms.              Anus:  normal sphincter tone, no lesions  Chaperone was present for exam.  Assessment:   Well woman visit with normal exam.   Plan: Mammogram screening. Recommended self breast awareness. Pap and HR HPV as above. Guidelines for Calcium, Vitamin D, regular exercise program including cardiovascular and weight bearing exercise.   Follow up annually and prn.    Additional counseling given.  {yes [id number]. _______ minutes face to face time of which over 50% was spent in counseling.    After visit summary provided.

## 2018-05-22 NOTE — Telephone Encounter (Signed)
Thank you for the update.  Encounter closed. 

## 2018-05-22 NOTE — Telephone Encounter (Signed)
Patient decided to cancel her appointment at check-in and will call back to reschedule.

## 2018-05-30 ENCOUNTER — Ambulatory Visit: Payer: 59 | Admitting: Obstetrics and Gynecology

## 2018-05-30 ENCOUNTER — Other Ambulatory Visit: Payer: Self-pay

## 2018-05-30 ENCOUNTER — Encounter: Payer: Self-pay | Admitting: Obstetrics and Gynecology

## 2018-05-30 VITALS — BP 110/60 | HR 68 | Resp 16 | Ht 65.0 in | Wt 138.0 lb

## 2018-05-30 DIAGNOSIS — Z01419 Encounter for gynecological examination (general) (routine) without abnormal findings: Secondary | ICD-10-CM

## 2018-05-30 DIAGNOSIS — Z1211 Encounter for screening for malignant neoplasm of colon: Secondary | ICD-10-CM | POA: Diagnosis not present

## 2018-05-30 DIAGNOSIS — R7989 Other specified abnormal findings of blood chemistry: Secondary | ICD-10-CM

## 2018-05-30 NOTE — Patient Instructions (Signed)

## 2018-05-30 NOTE — Progress Notes (Signed)
52 y.o. G35P2002 Married Caucasian female here for annual exam.    Some occasional pain in her right thumb.  Declines further evaluation.   Has athlete's foot and used a cream and she developed redness.  Put her own cream on it, and it feels better now.  Some hot flashes but not wanting treatment.   Taking a MVI per day.   Not sleeping as well.  Not walking very much.  Caring for grandchildren.   PCP: No PCP    Patient's last menstrual period was 10/14/2014 (approximate).           Sexually active: Yes.    The current method of family planning is status post hysterectomy.    Exercising: No.  The patient does not participate in regular exercise at present. Smoker:  no  Health Maintenance: Pap:  02-20-14 Neg:Neg HR HPV; 12-23-10 Neg History of abnormal Pap:  no MMG:  02/23/17 BIRADS 1 negative/density b.   Prefers to do every other year.  Colonoscopy:  Never BMD:   n/a  Result  n/a TDaP:  2012 Gardasil:   n/a HIV: Negative in pregnancy Hep C: never Screening Labs:  Discuss today   reports that she has never smoked. She has never used smokeless tobacco. She reports that she does not drink alcohol or use drugs.  Past Medical History:  Diagnosis Date  . Low vitamin D level 2018  . SVD (spontaneous vaginal delivery)    x 2  . Wears glasses     Past Surgical History:  Procedure Laterality Date  . CYSTOSCOPY N/A 11/05/2014   Procedure: CYSTOSCOPY;  Surgeon: Jamey Reas de Berton Lan, MD;  Location: [facility];  Service: Gynecology;  Laterality: N/A;  . EYE SURGERY  1999   Bilateral Lasik  . GYNECOLOGIC CRYOSURGERY     d/t heavy vaginal discharge  . MASS EXCISION Right 03/21/2014   Procedure: WIDE EXCISION MELANOMA RIGHT LEG;  Surgeon: Imogene Burn. Georgette Dover, MD;  Location: [facility];  Service: General;  Laterality: Right;  . ROBOTIC ASSISTED TOTAL HYSTERECTOMY Bilateral 11/05/2014   Procedure: ROBOTIC ASSISTED TOTAL HYSTERECTOMY WITH BILATERAL SALPINGECTOMY  ;  Surgeon: Everardo All Amundson de Berton Lan, MD;  Location: [facility];  Service: Gynecology;  Laterality: Bilateral;  . SHOULDER SURGERY Left 10/2015   torn Lt.labrum and bone spurs  . TONSILLECTOMY AND ADENOIDECTOMY    . WISDOM TOOTH EXTRACTION      No current outpatient medications on file.   No current facility-administered medications for this visit.     Family History  Problem Relation Age of Onset  . Hypertension Father   . Heart attack Father 59       bypass surgery  . Cancer Mother 19       Dec--bone CA  . Cancer Daughter 24       pancreatic cancer    Review of Systems  Constitutional: Negative.   HENT: Negative.   Eyes: Negative.   Respiratory: Negative.   Cardiovascular: Negative.   Gastrointestinal: Negative.   Endocrine: Negative.   Genitourinary: Negative.   Musculoskeletal: Negative.   Skin: Negative.   Allergic/Immunologic: Negative.   Neurological: Negative.   Hematological: Negative.   Psychiatric/Behavioral: Negative.     Exam:   BP 110/60 (BP Location: Right Arm, Patient Position: Sitting, Cuff Size: Normal)   Pulse 68   Resp 16   Ht 5\' 5"  (1.651 m)   Wt 138 lb (62.6 kg)   LMP 10/14/2014 (Approximate)  BMI 22.96 kg/m     General appearance: alert, cooperative and appears stated age Head: Normocephalic, without obvious abnormality, atraumatic Neck: no adenopathy, supple, symmetrical, trachea midline and thyroid normal to inspection and palpation Lungs: clear to auscultation bilaterally Breasts: normal appearance, no masses or tenderness, No nipple retraction or dimpling, No nipple discharge or bleeding, No axillary or supraclavicular adenopathy Heart: regular rate and rhythm Abdomen: soft, non-tender; no masses, no organomegaly Extremities: extremities normal, atraumatic, no cyanosis or edema Skin: Skin color, texture, turgor normal. No rashes or lesions Lymph nodes: Cervical, supraclavicular, and axillary nodes normal. No abnormal  inguinal nodes palpated Neurologic: Grossly normal  Pelvic: External genitalia:  no lesions              Urethra:  normal appearing urethra with no masses, tenderness or lesions              Bartholins and Skenes: normal                 Vagina: normal appearing vagina with normal color and discharge, no lesions              Cervix:  absent              Pap taken: No. Bimanual Exam:  Uterus:  absent              Adnexa: no mass, fullness, tenderness              Rectal exam: Yes.  .  Confirms.              Anus:  normal sphincter tone, no lesions  Chaperone was present for exam.  Assessment:   Well woman visit with normal exam. Status post robotic TLH and bilateral salpingectomy. Low vit D.  Hx melanoma.   Plan: Mammogram screening recommended.  [facility] Recommended self breast awareness. Pap and HR HPV as above. Guidelines for Calcium, Vitamin D, regular exercise program including cardiovascular and weight bearing exercise. Vit D level. Referral to GI for colonoscopy.   Follow up annually and prn.   After visit summary provided.

## 2018-05-31 ENCOUNTER — Encounter: Payer: Self-pay | Admitting: Gastroenterology

## 2018-05-31 LAB — VITAMIN D 25 HYDROXY (VIT D DEFICIENCY, FRACTURES): VIT D 25 HYDROXY: 31.9 ng/mL (ref 30.0–100.0)

## 2018-07-31 ENCOUNTER — Ambulatory Visit (AMBULATORY_SURGERY_CENTER): Payer: Self-pay

## 2018-07-31 ENCOUNTER — Encounter: Payer: Self-pay | Admitting: Gastroenterology

## 2018-07-31 VITALS — Ht 65.0 in | Wt 140.8 lb

## 2018-07-31 DIAGNOSIS — Z1211 Encounter for screening for malignant neoplasm of colon: Secondary | ICD-10-CM

## 2018-07-31 MED ORDER — NA SULFATE-K SULFATE-MG SULF 17.5-3.13-1.6 GM/177ML PO SOLN: 1.0000 | Freq: Once | ORAL | 0 refills | Status: AC

## 2018-07-31 NOTE — Progress Notes (Signed)
Denies allergies to eggs or soy products. Denies complication of anesthesia or sedation. Denies use of weight loss medication. Denies use of O2.   Emmi instructions declined.   Patient expressed during PV that she felt apprehension about having a colonoscopy because her father passed away from complications of a colonoscopy. I tried to reassure her that the procedure was generally safe but there was always a slight risk of complications with all procedures. Patient verbalized understanding.   Riki Sheer, LPN ( PV )

## 2018-08-14 ENCOUNTER — Encounter: Payer: Self-pay | Admitting: Gastroenterology

## 2018-08-14 ENCOUNTER — Ambulatory Visit (AMBULATORY_SURGERY_CENTER): Payer: 59 | Admitting: Gastroenterology

## 2018-08-14 VITALS — BP 138/76 | HR 69 | Temp 98.6°F | Resp 17 | Ht 65.0 in | Wt 140.0 lb

## 2018-08-14 DIAGNOSIS — Z1211 Encounter for screening for malignant neoplasm of colon: Secondary | ICD-10-CM

## 2018-08-14 MED ORDER — SODIUM CHLORIDE 0.9 % IV SOLN: 500.0000 mL | Freq: Once | INTRAVENOUS | Status: DC

## 2018-08-14 NOTE — Progress Notes (Signed)
Pt's states no medical or surgical changes since previsit or office visit. 

## 2018-08-14 NOTE — Progress Notes (Signed)
A/ox3, pleased with MAC, report to RN 

## 2018-08-14 NOTE — Op Note (Signed)
[facility] Patient Name: Natasha Villegas Procedure Date: 08/14/2018 9:37 AM MRN: [id number] Endoscopist: Mauri Pole , MD Age: 52 Referring MD:  Date of Birth: 07/31/1966 Gender: Female Account #: [id number] Procedure:                Colonoscopy Indications:              Screening for colorectal malignant neoplasm Medicines:                Monitored Anesthesia Care Procedure:                Pre-Anesthesia Assessment:                           - Prior to the procedure, a History and Physical                            was performed, and patient medications and                            allergies were reviewed. The patient's tolerance of                            previous anesthesia was also reviewed. The risks                            and benefits of the procedure and the sedation                            options and risks were discussed with the patient.                            All questions were answered, and informed consent                            was obtained. Prior Anticoagulants: The patient has                            taken no previous anticoagulant or antiplatelet                            agents. ASA Grade Assessment: I - A normal, healthy                            patient. After reviewing the risks and benefits,                            the patient was deemed in satisfactory condition to                            undergo the procedure.                           After obtaining informed consent, the colonoscope  was passed under direct vision. Throughout the                            procedure, the patient's blood pressure, pulse, and                            oxygen saturations were monitored continuously. The                            Model PCF-[id number]) scope was introduced                            through the anus and advanced to the the terminal                            ileum, with  identification of the appendiceal                            orifice and IC valve. The colonoscopy was performed                            without difficulty. The patient tolerated the                            procedure well. The quality of the bowel                            preparation was excellent. The terminal ileum,                            ileocecal valve, appendiceal orifice, and rectum                            were photographed. Scope In: 9:44:04 AM Scope Out: 9:57:46 AM Scope Withdrawal Time: 0 hours 9 minutes 41 seconds  Total Procedure Duration: 0 hours 13 minutes 42 seconds  Findings:                 The perianal and digital rectal examinations were                            normal.                           Non-bleeding internal hemorrhoids were found during                            retroflexion. The hemorrhoids were medium-sized. Complications:            No immediate complications. Estimated Blood Loss:     Estimated blood loss: none. Impression:               - Non-bleeding internal hemorrhoids.                           - No specimens collected. Recommendation:           -  Patient has a contact number available for                            emergencies. The signs and symptoms of potential                            delayed complications were discussed with the                            patient. Return to normal activities tomorrow.                            Written discharge instructions were provided to the                            patient.                           - Resume previous diet.                           - Continue present medications.                           - Repeat colonoscopy in 10 years for screening                            purposes.                           - Return to GI clinic PRN for hemorrhoidal band                            ligation Mauri Pole, MD 08/14/2018 10:01:55 AM This report has been signed  electronically.

## 2018-08-14 NOTE — Patient Instructions (Signed)
YOU HAD AN ENDOSCOPIC PROCEDURE TODAY AT [facility]:   Refer to the procedure report that was given to you for any specific questions about what was found during the examination.  If the procedure report does not answer your questions, please call your gastroenterologist to clarify.  If you requested that your care partner not be given the details of your procedure findings, then the procedure report has been included in a sealed envelope for you to review at your convenience later.  YOU SHOULD EXPECT: Some feelings of bloating in the abdomen. Passage of more gas than usual.  Walking can help get rid of the air that was put into your GI tract during the procedure and reduce the bloating. If you had a lower endoscopy (such as a colonoscopy or flexible sigmoidoscopy) you may notice spotting of blood in your stool or on the toilet paper. If you underwent a bowel prep for your procedure, you may not have a normal bowel movement for a few days.  Please Note:  You might notice some irritation and congestion in your nose or some drainage.  This is from the oxygen used during your procedure.  There is no need for concern and it should clear up in a day or so.  SYMPTOMS TO REPORT IMMEDIATELY:   Following lower endoscopy (colonoscopy or flexible sigmoidoscopy):  Excessive amounts of blood in the stool  Significant tenderness or worsening of abdominal pains  Swelling of the abdomen that is new, acute  Fever of 100F or higher  For urgent or emergent issues, a gastroenterologist can be reached at any hour by calling [phone].   DIET:  We do recommend a small meal at first, but then you may proceed to your regular diet.  Drink plenty of fluids but you should avoid alcoholic beverages for 24 hours. Try to increase the fiber in your diet, and drink plenty of water.  ACTIVITY:  You should plan to take it easy for the rest of today and you should NOT DRIVE or use heavy machinery until  tomorrow (because of the sedation medicines used during the test).    FOLLOW UP: Our staff will call the number listed on your records the next business day following your procedure to check on you and address any questions or concerns that you may have regarding the information given to you following your procedure. If we do not reach you, we will leave a message.  However, if you are feeling well and you are not experiencing any problems, there is no need to return our call.  We will assume that you have returned to your regular daily activities without incident.  You were given a handout on hemorrhoidal banding per Dr. Silverio Decamp.    SIGNATURES/CONFIDENTIALITY: You and/or your care partner have signed paperwork which will be entered into your electronic medical record.  These signatures attest to the fact that that the information above on your After Visit Summary has been reviewed and is understood.  Full responsibility of the confidentiality of this discharge information lies with you and/or your care-partner.  Read all handouts given to you by your recovery room nurse.

## 2018-08-15 ENCOUNTER — Telehealth: Payer: Self-pay | Admitting: *Deleted

## 2018-08-15 NOTE — Telephone Encounter (Signed)
  Follow up Call-  Call back number 08/14/2018  Post procedure Call Back phone  # [phone] cell  Permission to leave phone message Yes  Some recent data might be hidden     Patient questions:  Do you have a fever, pain , or abdominal swelling? No. Pain Score  0 *  Have you tolerated food without any problems? Yes.    Have you been able to return to your normal activities? Yes.    Do you have any questions about your discharge instructions: Diet   No. Medications  No. Follow up visit  No.  Do you have questions or concerns about your Care? No.  Actions: * If pain score is 4 or above: No action needed, pain <4.

## 2019-03-05 ENCOUNTER — Other Ambulatory Visit: Payer: Self-pay | Admitting: Gastroenterology

## 2019-03-06 LAB — HM MAMMOGRAPHY

## 2019-03-21 DIAGNOSIS — H00019 Hordeolum externum unspecified eye, unspecified eyelid: Secondary | ICD-10-CM | POA: Diagnosis not present

## 2019-04-23 ENCOUNTER — Telehealth: Payer: Self-pay | Admitting: Primary Care

## 2019-04-23 ENCOUNTER — Encounter: Payer: Self-pay | Admitting: Primary Care

## 2019-04-23 ENCOUNTER — Ambulatory Visit: Payer: BLUE CROSS/BLUE SHIELD | Admitting: Primary Care

## 2019-04-23 DIAGNOSIS — Z23 Encounter for immunization: Secondary | ICD-10-CM

## 2019-04-23 DIAGNOSIS — R040 Epistaxis: Secondary | ICD-10-CM

## 2019-04-23 LAB — PCMH DEPRESSION ASSESSMENT

## 2019-04-23 NOTE — Telephone Encounter (Signed)
Patient called. Stated that in the past month she has had 3 nose bleeds, this recent one had heavy bleeding and took 45 minutes to stop. She said that she became sick to her stomach because to the blood dripping down her throat. She was given and accepted a [facility] appointment at 11:30am.

## 2019-04-23 NOTE — Patient Instructions (Signed)
Will arrange prompt ENT consultation re nosebleeds, as discussed.    Shingles vaccine?

## 2019-04-23 NOTE — Telephone Encounter (Signed)
I spoke with [place] also, it hasn't been the same nare each time.  Last night was both, she said that tissue didn't work, she needed to use paper towel.  She had her head tipped back far, was not pinching nose at top of nose.  She said she had a clot, but ended up removing it when pulling paper towel out, she had waded paper towel in her nose.  She is no longer having this, it was last night.  She wants to review this with JB, aware we may refer to ENT for further work up.  Rooming completed.  FYI

## 2019-04-23 NOTE — Progress Notes (Signed)
Due to pandemic event, visit performed via:        Telephone     This is an established patient visit.    Reason for visit: Epistaxis      HPI:    Relapsing and stubborn nosebleeds recently, unusual.  No purulence, no fevers, no vision changes, no new HA.  No other bruising or bleeding noted.  Last night was both, she said that tissue didn't work, she needed to use paper towel.  She had her head tipped back far, was not pinching nose at top of nose.  She said she had a clot, but ended up removing it when pulling paper towel out, she had waded paper towel in her nose.  She is no longer having this, it was last night.    Patient's problem list, allergies, and medications were reviewed and updated as appropriate. Please see the EHR for full details.    Physical Exam:    This visit was performed during a pandemic event, thus the physical exam was not performed.    Assessment Plan:    AVM, vulnerable blood vessel, other?  Will seek ENT input.    Encouraged consideration of Shingrix.    The plan was discussed with the patient and the patient/patient rep demonstrated understanding to the provider's satisfaction.    Consent was previously obtained from the patient to complete this telephone consult; including the potential for financial liability.    11-20 minutes were spent on the phone with the patient, patient representatives, and/or other attendees.               Ruby Cola, MD

## 2019-04-24 ENCOUNTER — Encounter: Payer: Self-pay | Admitting: Primary Care

## 2019-05-02 ENCOUNTER — Telehealth: Payer: Self-pay | Admitting: Primary Care

## 2019-05-02 NOTE — Telephone Encounter (Signed)
I called Carrie Munoz and spoke with her.  She hasn't confirmed with insurance but will do so and then call me back about shingrix vaccine.

## 2019-05-02 NOTE — Telephone Encounter (Signed)
-----   Message from Ruby Cola, MD sent at 04/23/2019 11:54 AM EDT -----  Regarding: Shingrix  I sent her to ENT for the nosebleed.    She'd like to drive by for Shingrix at your convenience, [place] Jester.    Thank you!

## 2019-05-23 ENCOUNTER — Telehealth: Payer: Self-pay | Admitting: Obstetrics and Gynecology

## 2019-06-05 ENCOUNTER — Other Ambulatory Visit: Payer: Self-pay

## 2019-06-06 ENCOUNTER — Encounter: Payer: Self-pay | Admitting: Obstetrics and Gynecology

## 2019-06-06 ENCOUNTER — Ambulatory Visit: Payer: BC Managed Care – PPO | Admitting: Obstetrics and Gynecology

## 2019-06-06 ENCOUNTER — Telehealth: Payer: Self-pay | Admitting: Obstetrics and Gynecology

## 2019-06-06 ENCOUNTER — Other Ambulatory Visit: Payer: Self-pay

## 2019-06-06 VITALS — BP 124/80 | HR 88 | Temp 97.6°F | Resp 14 | Ht 65.5 in | Wt 148.0 lb

## 2019-06-06 DIAGNOSIS — Z01419 Encounter for gynecological examination (general) (routine) without abnormal findings: Secondary | ICD-10-CM

## 2019-06-06 NOTE — Telephone Encounter (Signed)
Erroneous encounter

## 2019-06-06 NOTE — Progress Notes (Signed)
53 y.o. G61P2002 Married Caucasian female here for annual exam.    No problems.  Hot flashes come and go.   Voiding often.  Drinks a lot of water and also coffee.  Donated blood yesterday.  Declines blood work.  Sees Dermatology in August, 2020.   Has gained weight.   PCP: No PCP    Patient's last menstrual period was 10/14/2014 (approximate).           Sexually active: Yes.    The current method of family planning is status post hysterectomy.    Exercising: Yes.    walking Smoker:  no  Health Maintenance: Pap:  02-20-14 Neg:Neg HR HPV; 12-23-10 Neg History of abnormal Pap:  no MMG:  02/23/17 BIRADS 1 negative/density b Colonoscopy:  08/14/18 internal hemorrhoids; f/u 10 years TDaP:  2012 Gardasil:   n/a HIV: donated blood Hep C: donated blood Screening Labs:  Discuss today   reports that she has never smoked. She has never used smokeless tobacco. She reports that she does not drink alcohol or use drugs.  Past Medical History:  Diagnosis Date  . Anemia   . Low vitamin D level 2018  . SVD (spontaneous vaginal delivery)    x 2  . Wears glasses     Past Surgical History:  Procedure Laterality Date  . CYSTOSCOPY N/A 11/05/2014   Procedure: CYSTOSCOPY;  Surgeon: Jamey Reas de Berton Lan, MD;  Location: [facility];  Service: Gynecology;  Laterality: N/A;  . EYE SURGERY  1999   Bilateral Lasik  . GYNECOLOGIC CRYOSURGERY     d/t heavy vaginal discharge  . MASS EXCISION Right 03/21/2014   Procedure: WIDE EXCISION MELANOMA RIGHT LEG;  Surgeon: Imogene Burn. Georgette Dover, MD;  Location: [facility];  Service: General;  Laterality: Right;  . ROBOTIC ASSISTED TOTAL HYSTERECTOMY Bilateral 11/05/2014   Procedure: ROBOTIC ASSISTED TOTAL HYSTERECTOMY WITH BILATERAL SALPINGECTOMY ;  Surgeon: Everardo All Amundson de Berton Lan, MD;  Location: [facility];  Service: Gynecology;  Laterality: Bilateral;  . SHOULDER SURGERY Left 10/2015   torn Lt.labrum and bone spurs  .  TONSILLECTOMY AND ADENOIDECTOMY    . WISDOM TOOTH EXTRACTION      Current Outpatient Medications  Medication Sig Dispense Refill  . Multiple Vitamin (MULTIVITAMIN) tablet Take 1 tablet by mouth daily.    Marland Kitchen levocetirizine (XYZAL) 5 MG tablet Take 5 mg by mouth every evening.     No current facility-administered medications for this visit.     Family History  Problem Relation Age of Onset  . Hypertension Father   . Heart attack Father 58       bypass surgery  . Cancer Mother 19       Dec--bone CA  . Cancer Daughter 24       pancreatic cancer  . Colon cancer Neg Hx   . Esophageal cancer Neg Hx   . Stomach cancer Neg Hx   . Rectal cancer Neg Hx     Review of Systems  Constitutional: Negative.   HENT: Negative.   Eyes: Negative.   Respiratory: Negative.   Cardiovascular: Negative.   Gastrointestinal: Negative.   Endocrine: Negative.   Genitourinary: Negative.   Musculoskeletal: Negative.   Skin: Negative.   Allergic/Immunologic: Negative.   Neurological: Negative.   Hematological: Negative.   Psychiatric/Behavioral: Negative.     Exam:   BP 124/80 (BP Location: Left Arm, Patient Position: Sitting, Cuff Size: Normal)   Pulse 88   Temp 97.6 F (  36.4 C) (Temporal)   Resp 14   Ht 5' 5.5" (1.664 m)   Wt 148 lb (67.1 kg)   LMP 10/14/2014 (Approximate)   BMI 24.25 kg/m     General appearance: alert, cooperative and appears stated age Head: normocephalic, without obvious abnormality, atraumatic Neck: no adenopathy, supple, symmetrical, trachea midline and thyroid normal to inspection and palpation Lungs: clear to auscultation bilaterally Breasts: normal appearance, no masses or tenderness, No nipple retraction or dimpling, No nipple discharge or bleeding, No axillary adenopathy Heart: regular rate and rhythm Abdomen: soft, non-tender; no masses, no organomegaly Extremities: extremities normal, atraumatic, no cyanosis or edema Skin: skin color, texture, turgor normal.  No rashes or lesions Lymph nodes: cervical, supraclavicular, and axillary nodes normal. Neurologic: grossly normal  Pelvic: External genitalia:  no lesions              No abnormal inguinal nodes palpated.              Urethra:  normal appearing urethra with no masses, tenderness or lesions              Bartholins and Skenes: normal                 Vagina: normal appearing vagina with normal color and discharge, no lesions              Cervix:  absent              Pap taken: No. Bimanual Exam:  Uterus:   absent              Adnexa: no mass, fullness, tenderness              Rectal exam: Yes.  .  Confirms.              Anus:  normal sphincter tone, no lesions  Chaperone was present for exam.  Assessment:   Well woman visit with normal exam. Status post robotic TLH and bilateral salpingectomy. Hx melanoma.   Plan: Mammogram screening discussed.  She will schedule. Self breast awareness reviewed. Pap and HR HPV as above. Guidelines for Calcium, Vitamin D, regular exercise program including cardiovascular and weight bearing exercise. Declines routine labs today. Follow up annually and prn.   After visit summary provided.

## 2019-06-06 NOTE — Patient Instructions (Signed)

## 2019-08-02 DIAGNOSIS — D1801 Hemangioma of skin and subcutaneous tissue: Secondary | ICD-10-CM | POA: Diagnosis not present

## 2019-08-02 DIAGNOSIS — Z85820 Personal history of malignant melanoma of skin: Secondary | ICD-10-CM | POA: Diagnosis not present

## 2019-08-02 DIAGNOSIS — D225 Melanocytic nevi of trunk: Secondary | ICD-10-CM | POA: Diagnosis not present

## 2019-08-02 DIAGNOSIS — L821 Other seborrheic keratosis: Secondary | ICD-10-CM | POA: Diagnosis not present

## 2019-10-29 ENCOUNTER — Telehealth: Payer: Self-pay | Admitting: Obstetrics and Gynecology

## 2019-10-29 NOTE — Telephone Encounter (Signed)
Patient is haviing more hot flashes and is asking for advice with this issue?

## 2019-10-29 NOTE — Telephone Encounter (Signed)
Spoke with pt. Updated pt on Dr Elza Rafter recommendations. Pt agreeable to try herbal Estroven. Will call back after giving it a try if doesn't work to discuss more options. Thank you Dr Quincy Simmonds.   Routing to provider for final review. Patient is agreeable to disposition. Will close encounter.

## 2019-10-29 NOTE — Telephone Encounter (Signed)
I would suggest herbal options.  She may want to try Estroven, which has both black cohosh and soy in it.  These are weak plant based estrogens.

## 2019-10-29 NOTE — Telephone Encounter (Signed)
Spoke with pt. Pt states having " a lot more" hot flashes now. Pt states no trigger or certain times of day. Pt states wanting know how to "fix" them without taking any kind of meds, especially nothing long term HRT therapy. Declines OV at this time.  Will route to Dr Quincy Simmonds for recommendations. Please advise.

## 2020-02-18 ENCOUNTER — Telehealth: Payer: Self-pay | Admitting: Obstetrics and Gynecology

## 2020-02-18 NOTE — Telephone Encounter (Signed)
Spoke with patient. Patient reports increase in hot flashes, has tried 2 different OTC estroven options, no relief. Patient request to review tx options. MyChart visit scheduled for 3/8 at 4:30pm with Dr. Quincy Simmonds. Patient declined earlier appt times.  Last AEX 06/06/19   Routing to provider for final review. Patient is agreeable to disposition. Will close encounter.

## 2020-02-18 NOTE — Telephone Encounter (Signed)
Patient would like to discuss treatment for menopausal issues.

## 2020-02-25 ENCOUNTER — Other Ambulatory Visit: Payer: Self-pay

## 2020-02-25 ENCOUNTER — Telehealth: Payer: Self-pay

## 2020-02-25 ENCOUNTER — Encounter: Payer: Self-pay | Admitting: Obstetrics and Gynecology

## 2020-02-25 ENCOUNTER — Telehealth (INDEPENDENT_AMBULATORY_CARE_PROVIDER_SITE_OTHER): Payer: BC Managed Care – PPO | Admitting: Obstetrics and Gynecology

## 2020-02-25 VITALS — Ht 65.5 in

## 2020-02-25 DIAGNOSIS — N951 Menopausal and female climacteric states: Secondary | ICD-10-CM

## 2020-02-25 NOTE — Telephone Encounter (Signed)
Called patient to update chart for Webvisit this afternoon. Left message to call Estill Bamberg, CMA.

## 2020-02-25 NOTE — Progress Notes (Signed)
GYNECOLOGY  VISIT   HPI: 54 y.o.   Married  Caucasian  female   G2P2002 with Patient's last menstrual period was 10/14/2014 (approximate).   here for Webex to discuss HRT for hot flashes.   She gives consent for the WebEx visit video visit arranged by Glorianne Manchester, RN I am in my office.  She is in her kitchen.  Started 4:44 pm. Ended at 5:07 pm. Time 23 minutes.  Hot flashes started in June, 2020.  She eventually started [place], and it is not helping.  Trouble with sleeping and lack of energy.  Some daytime hot flashes and at night.   GYNECOLOGIC HISTORY: Patient's last menstrual period was 10/14/2014 (approximate). Contraception: Hyst Menopausal hormone therapy:  NA Last mammogram: 02/23/17 BIRADS 1 negative/density b Last pap smear: 02-20-14 Neg:Neg HR HPV; 12-23-10 Neg        OB History    Gravida  2   Para  2   Term  2   Preterm  0   AB  0   Living  2     SAB  0   TAB  0   Ectopic  0   Multiple  0   Live Births  2        Obstetric Comments  1 adopted son in 3/94           Patient Active Problem List   Diagnosis Date Noted  . Status post laparoscopic hysterectomy 11/05/2014  . Simple endometrial hyperplasia without atypia 10/23/2014  . Fibroids 10/23/2014  . Malignant melanoma of right lower leg ([place]) 03/11/2014    Past Medical History:  Diagnosis Date  . Anemia   . Low vitamin D level 2018  . SVD (spontaneous vaginal delivery)    x 2  . Wears glasses     Past Surgical History:  Procedure Laterality Date  . CYSTOSCOPY N/A 11/05/2014   Procedure: CYSTOSCOPY;  Surgeon: Jamey Reas de Berton Lan, MD;  Location: [facility];  Service: Gynecology;  Laterality: N/A;  . EYE SURGERY  1999   Bilateral Lasik  . GYNECOLOGIC CRYOSURGERY     d/t heavy vaginal discharge  . MASS EXCISION Right 03/21/2014   Procedure: WIDE EXCISION MELANOMA RIGHT LEG;  Surgeon: Imogene Burn. Georgette Dover, MD;  Location: [facility];  Service: General;   Laterality: Right;  . ROBOTIC ASSISTED TOTAL HYSTERECTOMY Bilateral 11/05/2014   Procedure: ROBOTIC ASSISTED TOTAL HYSTERECTOMY WITH BILATERAL SALPINGECTOMY ;  Surgeon: Everardo All Amundson de Berton Lan, MD;  Location: [facility];  Service: Gynecology;  Laterality: Bilateral;  . SHOULDER SURGERY Left 10/2015   torn Lt.labrum and bone spurs  . TONSILLECTOMY AND ADENOIDECTOMY    . WISDOM TOOTH EXTRACTION      Current Outpatient Medications  Medication Sig Dispense Refill  . levocetirizine (XYZAL) 5 MG tablet Take 5 mg by mouth every evening.    . Multiple Vitamin (MULTIVITAMIN) tablet Take 1 tablet by mouth daily.     No current facility-administered medications for this visit.     ALLERGIES: Patient has no known allergies.  Family History  Problem Relation Age of Onset  . Hypertension Father   . Heart attack Father 34       bypass surgery  . Cancer Mother 38       Dec--bone CA  . Cancer Daughter 24       pancreatic cancer  . Colon cancer Neg Hx   . Esophageal cancer Neg Hx   . Stomach  cancer Neg Hx   . Rectal cancer Neg Hx     Social History   Socioeconomic History  . Marital status: Married    Spouse name: Not on file  . Number of children: Not on file  . Years of education: 3  . Highest education level: Not on file  Occupational History  . 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    Tobacco Use  . Smoking status: Never Smoker  . Smokeless tobacco: Never Used  Substance and Sexual Activity  . Alcohol use: No    Alcohol/week: 0.0 standard drinks  . Drug use: No  . Sexual activity: Yes    Partners: Male    Birth control/protection: Surgical    Comment: R-TLH  Other Topics Concern  . Not on file  Social History Narrative  . Not on file   Social Determinants of Health   Financial Resource Strain:   . Difficulty of Paying Living Expenses: Not on file  Food Insecurity:   . Worried About Charity fundraiser in the Last Year: Not on file   . Ran Out of Food in the Last Year: Not on file  Transportation Needs:   . Lack of Transportation (Medical): Not on file  . Lack of Transportation (Non-Medical): Not on file  Physical Activity:   . Days of Exercise per Week: Not on file  . Minutes of Exercise per Session: Not on file  Stress:   . Feeling of Stress : Not on file  Social Connections:   . Frequency of Communication with Friends and Family: Not on file  . Frequency of Social Gatherings with Friends and Family: Not on file  . Attends Religious Services: Not on file  . Active Member of Clubs or Organizations: Not on file  . Attends Archivist Meetings: Not on file  . Marital Status: Not on file  Intimate Partner Violence:   . Fear of Current or Ex-Partner: Not on file  . Emotionally Abused: Not on file  . Physically Abused: Not on file  . Sexually Abused: Not on file    Review of Systems  Constitutional:       Hot flashes  All other systems reviewed and are negative.   PHYSICAL EXAMINATION:    Ht 5' 5.5" (1.664 m)   LMP 10/14/2014 (Approximate)   BMI 24.25 kg/m     General appearance: alert, cooperative and appears stated age  ASSESSMENT   Menopausal symptoms.   PLAN  We discussed options for care - ERT, Gabapentin, SSRIs/SNRIs, herbal remedies.   Risks and benefits of each reviewed.  She chooses Estroven with melatonin.  She will try plant based estrogens in her diet as well. Marland Kitchen  Update mammogram.  FU in June.    An After Visit Summary was printed and given to the patient.  __23____ minutes face to face time of which over 50% was spent in counseling.

## 2020-02-25 NOTE — Telephone Encounter (Signed)
Spoke with patient and updated chart for her Webvisit today at 4:30pm.

## 2020-02-25 NOTE — Telephone Encounter (Signed)
Patient returned call

## 2020-03-07 ENCOUNTER — Other Ambulatory Visit: Payer: Self-pay | Admitting: Obstetrics and Gynecology

## 2020-03-07 DIAGNOSIS — Z1231 Encounter for screening mammogram for malignant neoplasm of breast: Secondary | ICD-10-CM

## 2020-03-12 ENCOUNTER — Other Ambulatory Visit: Payer: Self-pay | Admitting: Pulmonary Disease

## 2020-03-12 DIAGNOSIS — Z23 Encounter for immunization: Secondary | ICD-10-CM

## 2020-03-13 ENCOUNTER — Ambulatory Visit
Admission: RE | Admit: 2020-03-13 | Discharge: 2020-03-13 | Disposition: A | Discharge status: Home / Self Care | Payer: BLUE CROSS/BLUE SHIELD | Source: Ambulatory Visit

## 2020-03-13 ENCOUNTER — Other Ambulatory Visit: Payer: Self-pay

## 2020-03-13 DIAGNOSIS — Z1231 Encounter for screening mammogram for malignant neoplasm of breast: Secondary | ICD-10-CM

## 2020-05-08 ENCOUNTER — Telehealth: Payer: Self-pay | Admitting: Gastroenterology

## 2020-05-08 NOTE — Telephone Encounter (Signed)
Ok, thanks.

## 2020-05-08 NOTE — Telephone Encounter (Signed)
She is not having any active problems, no rectal pain or bleeding. She is not having constipation. Last seen for her colonoscopy 08/14/2018. Not on anti-coagulation. Okay to schedule?

## 2020-05-08 NOTE — Telephone Encounter (Signed)
Scheduled for 05/12/20 at 3:30pm. Arrive 3:20pm.

## 2020-05-09 DIAGNOSIS — M5416 Radiculopathy, lumbar region: Secondary | ICD-10-CM | POA: Diagnosis not present

## 2020-05-12 ENCOUNTER — Telehealth: Payer: Self-pay | Admitting: Gastroenterology

## 2020-05-12 ENCOUNTER — Encounter: Payer: Self-pay | Admitting: Gastroenterology

## 2020-05-12 ENCOUNTER — Ambulatory Visit: Payer: BC Managed Care – PPO | Admitting: Gastroenterology

## 2020-05-12 VITALS — BP 119/68 | HR 96 | Ht 66.0 in | Wt 147.0 lb

## 2020-05-12 DIAGNOSIS — K6289 Other specified diseases of anus and rectum: Secondary | ICD-10-CM | POA: Diagnosis not present

## 2020-05-12 DIAGNOSIS — K602 Anal fissure, unspecified: Secondary | ICD-10-CM

## 2020-05-12 DIAGNOSIS — K625 Hemorrhage of anus and rectum: Secondary | ICD-10-CM

## 2020-05-12 MED ORDER — AMBULATORY NON FORMULARY MEDICATION: 0 refills | Status: DC

## 2020-05-12 NOTE — Telephone Encounter (Signed)
Pt is scheduled for a banding today at 3:30pm. She states that she was put on Prednisone since Saturday so wants to make sure that it is fine to proceed with banding. Pls call her.

## 2020-05-12 NOTE — Patient Instructions (Addendum)
If you are age 56 or older, your body mass index should be between 23-30. Your Body mass index is 23.73 kg/m. If this is out of the aforementioned range listed, please consider follow up with your Primary Care Provider.  If you are age 70 or younger, your body mass index should be between 19-25. Your Body mass index is 23.73 kg/m. If this is out of the aformentioned range listed, please consider follow up with your Primary Care Provider.    Please purchase the following medications over the counter and take as directed:  START: Benefiber take 1 tablespoon twice daily.   We have sent a prescription for nitroglycerin 0.125% gel to [facility]. You should apply a pea size amount to your rectum three times daily x 6-8 weeks.  [facility]'s information is below: Address: [address], [place], [place] [address]  [phone]  *Please DO NOT go directly from our office to pick up this medication! Give the pharmacy 1 day to process the prescription as this is compounded and takes time to make.   You will need a follow up appointment in 2 - 3 months with Dr Silverio Decamp.  I appreciate the opportunity to care for you. Thank you for choosing me and [facility],  Dr. Harl Bowie

## 2020-05-12 NOTE — Telephone Encounter (Signed)
Spoke with the patient. 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 She does not want to reschedule the banding

## 2020-05-12 NOTE — Progress Notes (Signed)
PROCEDURE NOTE: The patient presents with symptomatic grade 2  hemorrhoids, requesting rubber band ligation of his/her hemorrhoidal disease.  All risks, benefits and alternative forms of therapy were described and informed consent was obtained.  In the Left Lateral Decubitus position anoscopic examination revealed anterior anal fissure and grade 2 hemorrhoids in the right posterior and left lateral position(s).  The decision was made to not proceed with hemorrhoidal band ligation until after healing of the anal fissure     The following adjunctive treatments were recommended:  Apply nitroglycerin 0.125% per rectum small pea-sized amount 3 times daily for 6 to 8 weeks Benefiber 1 tablespoon twice daily with meals Increase daily water intake to 60 to 80 ounces daily  The patient will return in 6 to 8 weeks for  follow-up and possible hemorrhoidal  banding as required. No complications were encountered and the patient tolerated the procedure well.  Damaris Hippo , MD [phone]

## 2020-06-10 ENCOUNTER — Other Ambulatory Visit: Payer: Self-pay

## 2020-06-10 NOTE — Progress Notes (Signed)
54 y.o. G24P2002 Married Caucasian female here for annual exam.    She is using Black Cohosh twice a day, and no more hot flashes.  Sleep quality is improved.  No vaginal dryness or discomfort with intercourse.   Took a course of prednisone for right hip/leg pain through her PCP.  Can feel some numbness in her leg at times.  Wants labs today.  Covid vaccine completed.  March 16 and March 25, 2020.   [place]   PCP:   Hulan Fess, MD  Patient's last menstrual period was 10/14/2014 (approximate).           Sexually active: Yes.    The current method of family planning is status post hysterectomy.    Exercising: No.  The patient does not participate in regular exercise at present. Smoker:  no  Health Maintenance: Pap:  02-20-14 Neg:Neg HR HPV; 12-23-10 Neg History of abnormal Pap:  no MMG: 03-13-20 3D/Neg/density B/birads1 Colonoscopy: 08/14/18 internal hemorrhoids; f/u 10 years BMD:   n/a  Result  n/a TDaP: 2012 Gardasil:   no HIV: donated blood Hep C: donated blood Screening Labs:  Today.    reports that she has never smoked. She has never used smokeless tobacco. She reports that she does not drink alcohol and does not use drugs.  Past Medical History:  Diagnosis Date  . Anemia   . Low vitamin D level 2018  . SVD (spontaneous vaginal delivery)    x 2  . Wears glasses     Past Surgical History:  Procedure Laterality Date  . CYSTOSCOPY N/A 11/05/2014   Procedure: CYSTOSCOPY;  Surgeon: Jamey Reas de Berton Lan, MD;  Location: [facility];  Service: Gynecology;  Laterality: N/A;  . EYE SURGERY  1999   Bilateral Lasik  . GYNECOLOGIC CRYOSURGERY     d/t heavy vaginal discharge  . MASS EXCISION Right 03/21/2014   Procedure: WIDE EXCISION MELANOMA RIGHT LEG;  Surgeon: Imogene Burn. Georgette Dover, MD;  Location: [facility];  Service: General;  Laterality: Right;  . ROBOTIC ASSISTED TOTAL HYSTERECTOMY Bilateral 11/05/2014   Procedure:  ROBOTIC ASSISTED TOTAL HYSTERECTOMY WITH BILATERAL SALPINGECTOMY ;  Surgeon: Everardo All Amundson de Berton Lan, MD;  Location: [facility];  Service: Gynecology;  Laterality: Bilateral;  . SHOULDER SURGERY Left 10/2015   torn Lt.labrum and bone spurs  . TONSILLECTOMY AND ADENOIDECTOMY    . WISDOM TOOTH EXTRACTION      Current Outpatient Medications  Medication Sig Dispense Refill  . BLACK COHOSH EXTRACT PO Take by mouth.    . Multiple Vitamin (MULTIVITAMIN) tablet Take 1 tablet by mouth daily.     No current facility-administered medications for this visit.    Family History  Problem Relation Age of Onset  . Hypertension Father   . Heart attack Father 74       bypass surgery  . Cancer Mother 60       Dec--bone CA  . Cancer Daughter 24       pancreatic cancer  . Colon cancer Neg Hx   . Esophageal cancer Neg Hx   . Stomach cancer Neg Hx   . Rectal cancer Neg Hx     Review of Systems  All other systems reviewed and are negative.   Exam:   BP 136/82   Pulse 70   Temp (!) 97.3 F (36.3 C) (Temporal)   Resp 14   Ht 5\' 5"  (1.651 m)   Wt  149 lb (67.6 kg)   LMP 10/14/2014 (Approximate)   BMI 24.79 kg/m     General appearance: alert, cooperative and appears stated age Head: normocephalic, without obvious abnormality, atraumatic Neck: no adenopathy, supple, symmetrical, trachea midline and thyroid normal to inspection and palpation Lungs: clear to auscultation bilaterally Breasts: normal appearance, no masses or tenderness, No nipple retraction or dimpling, No nipple discharge or bleeding, No axillary adenopathy Heart: regular rate and rhythm Abdomen: soft, non-tender; no masses, no organomegaly Extremities: extremities normal, atraumatic, no cyanosis or edema Skin: skin color, texture, turgor normal. No rashes or lesions Lymph nodes: cervical, supraclavicular, and axillary nodes normal. Neurologic: grossly normal  Pelvic: External genitalia:  no lesions              No  abnormal inguinal nodes palpated.              Urethra:  normal appearing urethra with no masses, tenderness or lesions              Bartholins and Skenes: normal                 Vagina: normal appearing vagina with normal color and discharge, no lesions              Cervix: absent              Pap taken: No. Bimanual Exam:  Uterus:  Absent.              Adnexa: no mass, fullness, tenderness              Rectal exam: Yes.  .  Confirms.              Anus:  normal sphincter tone, no lesions  Chaperone was present for exam.  Assessment:   Well woman visit with normal exam. Status post robotic TLH and bilateral salpingectomy. Menopausal symptoms.  Hx melanoma.  Plan: Mammogram screening discussed. Self breast awareness reviewed. Pap and HR HPV as above. Guidelines for Calcium, Vitamin D, regular exercise program including cardiovascular and weight bearing exercise. Routine labs. Follow up annually and prn.   After visit summary provided.

## 2020-06-11 ENCOUNTER — Encounter: Payer: Self-pay | Admitting: Obstetrics and Gynecology

## 2020-06-11 ENCOUNTER — Ambulatory Visit: Payer: BC Managed Care – PPO | Admitting: Obstetrics and Gynecology

## 2020-06-11 VITALS — BP 136/82 | HR 70 | Temp 97.3°F | Resp 14 | Ht 65.0 in | Wt 149.0 lb

## 2020-06-11 DIAGNOSIS — Z01419 Encounter for gynecological examination (general) (routine) without abnormal findings: Secondary | ICD-10-CM

## 2020-06-11 NOTE — Patient Instructions (Signed)

## 2020-06-12 LAB — CBC
Hematocrit: 38.9 % (ref 34.0–46.6)
Hemoglobin: 12.1 g/dL (ref 11.1–15.9)
MCH: 25.6 pg — ABNORMAL LOW (ref 26.6–33.0)
MCHC: 31.1 g/dL — ABNORMAL LOW (ref 31.5–35.7)
MCV: 82 fL (ref 79–97)
Platelets: 468 10*3/uL — ABNORMAL HIGH (ref 150–450)
RBC: 4.73 x10E6/uL (ref 3.77–5.28)
RDW: 14.3 % (ref 11.7–15.4)
WBC: 5.9 10*3/uL (ref 3.4–10.8)

## 2020-06-12 LAB — LIPID PANEL
Chol/HDL Ratio: 3.4 ratio (ref 0.0–4.4)
Cholesterol, Total: 218 mg/dL — ABNORMAL HIGH (ref 100–199)
HDL: 64 mg/dL (ref >39)
LDL Chol Calc (NIH): 127 mg/dL — ABNORMAL HIGH (ref 0–99)
Triglycerides: 156 mg/dL — ABNORMAL HIGH (ref 0–149)
VLDL Cholesterol Cal: 27 mg/dL (ref 5–40)

## 2020-06-12 LAB — COMPREHENSIVE METABOLIC PANEL
ALT: 36 IU/L — ABNORMAL HIGH (ref 0–32)
AST: 29 IU/L (ref 0–40)
Albumin/Globulin Ratio: 2.2 (ref 1.2–2.2)
Albumin: 4.6 g/dL (ref 3.8–4.9)
Alkaline Phosphatase: 96 IU/L (ref 48–121)
BUN/Creatinine Ratio: 15 (ref 9–23)
BUN: 11 mg/dL (ref 6–24)
Bilirubin Total: <0.2 mg/dL (ref 0.0–1.2)
CO2: 26 mmol/L (ref 20–29)
Calcium: 9.7 mg/dL (ref 8.7–10.2)
Chloride: 103 mmol/L (ref 96–106)
Creatinine, Ser: 0.74 mg/dL (ref 0.57–1.00)
GFR calc Af Amer: 105 mL/min/{1.73_m2} (ref >59)
GFR calc non Af Amer: 91 mL/min/{1.73_m2} (ref >59)
Globulin, Total: 2.1 g/dL (ref 1.5–4.5)
Glucose: 77 mg/dL (ref 65–99)
Potassium: 4.5 mmol/L (ref 3.5–5.2)
Sodium: 142 mmol/L (ref 134–144)
Total Protein: 6.7 g/dL (ref 6.0–8.5)

## 2020-06-17 ENCOUNTER — Telehealth: Payer: Self-pay

## 2020-06-17 NOTE — Telephone Encounter (Signed)
Spoke with pt. Pt given results and recommendations per Dr Quincy Simmonds. Pt agreeable and verbalized understanding. Pt states will call and make appt with PCP for fasting labs.   Encounter closed.

## 2020-06-17 NOTE — Telephone Encounter (Signed)
-----   Message from Nunzio Cobbs, MD sent at 06/13/2020  6:26 AM EDT ----- Please contact patient in follow up to her lab work.  Her cholesterol, triglycerides, platelets and ALT liver enzyme were slightly elevated.  The ranges of these results do look similar to what she has had in the past.  I recommend she do follow up with her PCP and do fasting labs.

## 2020-06-25 DIAGNOSIS — M5416 Radiculopathy, lumbar region: Secondary | ICD-10-CM | POA: Diagnosis not present

## 2020-07-10 DIAGNOSIS — M5136 Other intervertebral disc degeneration, lumbar region: Secondary | ICD-10-CM | POA: Diagnosis not present

## 2020-07-10 DIAGNOSIS — M545 Low back pain: Secondary | ICD-10-CM | POA: Diagnosis not present

## 2020-07-10 DIAGNOSIS — M7061 Trochanteric bursitis, right hip: Secondary | ICD-10-CM | POA: Diagnosis not present

## 2020-07-23 ENCOUNTER — Other Ambulatory Visit: Payer: Self-pay | Admitting: Gastroenterology

## 2020-07-23 LAB — HM MAMMOGRAPHY

## 2020-07-28 DIAGNOSIS — M545 Low back pain: Secondary | ICD-10-CM | POA: Diagnosis not present

## 2020-07-28 DIAGNOSIS — M25551 Pain in right hip: Secondary | ICD-10-CM | POA: Diagnosis not present

## 2020-08-07 ENCOUNTER — Encounter: Payer: Self-pay | Admitting: Primary Care

## 2020-08-07 ENCOUNTER — Other Ambulatory Visit: Payer: Self-pay | Admitting: Gastroenterology

## 2020-09-15 ENCOUNTER — Telehealth: Payer: Self-pay

## 2020-09-15 NOTE — Telephone Encounter (Signed)
Spoke with pt. Pt states having increased vasomotor sx of hot flashes, night sweats and insomnia again after taking Prednisone Rx a few months ago. States tried using [employer] again and its not working now. Pt requesting virtual visit with Dr Quincy Simmonds to discuss HRT.  Last AEX 05/2020 Last MMG 02/2020- Birads 1, neg   Pt advised ok to have Mychart visit. Pt scheduled with Dr Quincy Simmonds on 09/18/20 at 1100 am. Pt agreeable and verbalized understanding to date and time of appt. Pt aware how to use Mychart.  Encounter closed.

## 2020-09-15 NOTE — Telephone Encounter (Signed)
Patient is calling in regards to wanting an appointment for hormone replacement.

## 2020-09-15 NOTE — Telephone Encounter (Signed)
Left message for pt to return call to triage RN. 

## 2020-09-17 NOTE — Progress Notes (Signed)
GYNECOLOGY  VISIT   HPI: 54 y.o.   Married  Caucasian  female   G2P2002 with Patient's last menstrual period was 10/14/2014 (approximate).   here for My Chart Web-Ex to discuss hot flashes, night sweats and insomnia Arranged by Evern Core, RN. She confirms her identity. She gives permission. I am in my office.  She is in her dining room.  Started at 11:18, ended 11:37. Chart review until 11:46.  She is having hot flashes and night sweats.  She is interested in improving her sleep.  She states it makes her feel weird.   She was on black cohosh and did well.  She took prednisone and then cortisone in her hip and sciatica and her hot flashes have increased again.   When she walks she feels a weight in her heart and down both of her arms.  She has seen a cardiologist in the past and she had a normal evaluation in 2017.  She has increased heart rate with a small amount of exertion.   GYNECOLOGIC HISTORY: Patient's last menstrual period was 10/14/2014 (approximate). Contraception:  Hyst Menopausal hormone therapy: none Last mammogram: 03-13-20 3D/Neg/density B/birads1 Last pap smear: 02-20-14 Neg:Neg HR HPV; 12-23-10 Neg History of abnormal Pap:  no        OB History    Gravida  2   Para  2   Term  2   Preterm  0   AB  0   Living  2     SAB  0   TAB  0   Ectopic  0   Multiple  0   Live Births  2        Obstetric Comments  1 adopted son in 3/94           Patient Active Problem List   Diagnosis Date Noted  . Status post laparoscopic hysterectomy 11/05/2014  . Simple endometrial hyperplasia without atypia 10/23/2014  . Fibroids 10/23/2014  . Malignant melanoma of right lower leg ([place]) 03/11/2014    Past Medical History:  Diagnosis Date  . Anemia   . Low vitamin D level 2018  . SVD (spontaneous vaginal delivery)    x 2  . Wears glasses     Past Surgical History:  Procedure Laterality Date  . CYSTOSCOPY N/A 11/05/2014   Procedure: CYSTOSCOPY;   Surgeon: Jamey Reas de Berton Lan, MD;  Location: [facility];  Service: Gynecology;  Laterality: N/A;  . EYE SURGERY  1999   Bilateral Lasik  . GYNECOLOGIC CRYOSURGERY     d/t heavy vaginal discharge  . MASS EXCISION Right 03/21/2014   Procedure: WIDE EXCISION MELANOMA RIGHT LEG;  Surgeon: Imogene Burn. Georgette Dover, MD;  Location: [place] [facility];  Service: General;  Laterality: Right;  . ROBOTIC ASSISTED TOTAL HYSTERECTOMY Bilateral 11/05/2014   Procedure: ROBOTIC ASSISTED TOTAL HYSTERECTOMY WITH BILATERAL SALPINGECTOMY ;  Surgeon: Everardo All Amundson de Berton Lan, MD;  Location: [facility];  Service: Gynecology;  Laterality: Bilateral;  . SHOULDER SURGERY Left 10/2015   torn Lt.labrum and bone spurs  . TONSILLECTOMY AND ADENOIDECTOMY    . WISDOM TOOTH EXTRACTION      Current Outpatient Medications  Medication Sig Dispense Refill  . BLACK COHOSH EXTRACT PO Take by mouth.    . Multiple Vitamin (MULTIVITAMIN) tablet Take 1 tablet by mouth daily.    Marland Kitchen estradiol (MINIVELLE) 0.05 MG/24HR patch Place 1 patch (0.05 mg total) onto the skin 2 (two) times a week. 8 patch  2   No current facility-administered medications for this visit.     ALLERGIES: Patient has no known allergies.  Family History  Problem Relation Age of Onset  . Hypertension Father   . Heart attack Father 48       bypass surgery  . Cancer Mother 13       Dec--bone CA  . Cancer Daughter 24       pancreatic cancer  . Colon cancer Neg Hx   . Esophageal cancer Neg Hx   . Stomach cancer Neg Hx   . Rectal cancer Neg Hx     Social History   Socioeconomic History  . Marital status: Married    Spouse name: Not on file  . Number of children: Not on file  . Years of education: 3  . Highest education level: Not on file  Occupational History  . 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    Tobacco Use  . Smoking status: Never Smoker  . Smokeless tobacco: Never Used  Vaping Use  . Vaping  Use: Never used  Substance and Sexual Activity  . Alcohol use: No    Alcohol/week: 0.0 standard drinks  . Drug use: No  . Sexual activity: Yes    Partners: Male    Birth control/protection: Surgical    Comment: R-TLH  Other Topics Concern  . Not on file  Social History Narrative  . Not on file   Social Determinants of Health   Financial Resource Strain:   . Difficulty of Paying Living Expenses: Not on file  Food Insecurity:   . Worried About Charity fundraiser in the Last Year: Not on file  . Ran Out of Food in the Last Year: Not on file  Transportation Needs:   . Lack of Transportation (Medical): Not on file  . Lack of Transportation (Non-Medical): Not on file  Physical Activity:   . Days of Exercise per Week: Not on file  . Minutes of Exercise per Session: Not on file  Stress:   . Feeling of Stress : Not on file  Social Connections:   . Frequency of Communication with Friends and Family: Not on file  . Frequency of Social Gatherings with Friends and Family: Not on file  . Attends Religious Services: Not on file  . Active Member of Clubs or Organizations: Not on file  . Attends Archivist Meetings: Not on file  . Marital Status: Not on file  Intimate Partner Violence:   . Fear of Current or Ex-Partner: Not on file  . Emotionally Abused: Not on file  . Physically Abused: Not on file  . Sexually Abused: Not on file    Review of Systems  Constitutional: Negative.   HENT: Negative.   Eyes: Negative.   Respiratory: Negative.   Cardiovascular: Negative.   Gastrointestinal: Negative.   Endocrine: Negative.   Genitourinary: Negative.   Musculoskeletal: Negative.   Skin: Negative.   Allergic/Immunologic: Negative.   Neurological: Negative.   Hematological: Negative.   Psychiatric/Behavioral: Negative.     PHYSICAL EXAMINATION:    LMP 10/14/2014 (Approximate)     General appearance: alert, cooperative and appears stated age  Chart review performed  for cardiac evaluation.    ASSESSMENT  Menopausal symptoms.  Atypical chest pain. Normal coronary calcium score of 0.  No evidence of CAD. Palpitations.   PLAN  We discussed estrogen therapy versus SSRI, Paxil.  Risks of benefits reviewed.  She chooses transdermal estrogen,  Vivelle Dot 0.05 mg twice weekly. WHI discussed and risk of stroke, DVT, and PE.  She is instructed to stop the patch if she has an increase in her cardiac symptoms. Will refer back to cardiology. Fu in 2 - 3 months.

## 2020-09-18 ENCOUNTER — Encounter: Payer: Self-pay | Admitting: Obstetrics and Gynecology

## 2020-09-18 ENCOUNTER — Telehealth (INDEPENDENT_AMBULATORY_CARE_PROVIDER_SITE_OTHER): Payer: BC Managed Care – PPO | Admitting: Obstetrics and Gynecology

## 2020-09-18 DIAGNOSIS — N951 Menopausal and female climacteric states: Secondary | ICD-10-CM | POA: Diagnosis not present

## 2020-09-18 DIAGNOSIS — R0789 Other chest pain: Secondary | ICD-10-CM

## 2020-09-18 DIAGNOSIS — I499 Cardiac arrhythmia, unspecified: Secondary | ICD-10-CM | POA: Diagnosis not present

## 2020-09-18 MED ORDER — ESTRADIOL 0.05 MG/24HR TD PTTW
1.0000 | MEDICATED_PATCH | TRANSDERMAL | 2 refills | Status: DC
Start: 2020-09-18 — End: 2020-12-09

## 2020-09-18 NOTE — Patient Instructions (Signed)
Estradiol skin patches What is this medicine? ESTRADIOL (es tra DYE ole) skin patches contain an estrogen. It is mostly used as hormone replacement in menopausal women. It helps to treat hot flashes and prevent osteoporosis. It is also used to treat women with low estrogen levels or those who have had their ovaries removed. This medicine may be used for other purposes; ask your health care provider or pharmacist if you have questions. COMMON BRAND NAME(S): Alora, Climara, DOTTI, Esclim, Estraderm, FemPatch, Menostar, Minivelle, Vivelle, Vivelle-Dot What should I tell my health care provider before I take this medicine? They need to know if you have any of these conditions:  abnormal vaginal bleeding  blood vessel disease or blood clots  breast, cervical, endometrial, ovarian, liver, or uterine cancer  dementia  diabetes  gallbladder disease  heart disease or recent heart attack  high blood pressure  high cholesterol  high level of calcium in the blood  hysterectomy  kidney disease  liver disease  migraine headaches  protein C deficiency  protein S deficiency  stroke  systemic lupus erythematosus (SLE)  tobacco smoker  an unusual or allergic reaction to estrogens, other hormones, medicines, foods, dyes, or preservatives  pregnant or trying to get pregnant  breast-feeding How should I use this medicine? This medicine is for external use only. Follow the directions on the prescription label. Tear open the pouch, do not use scissors. Remove the stiff protective liner covering the adhesive. Try not to touch the adhesive. Apply the patch, sticky side to the skin, to an area that is clean, dry and hairless. Avoid injured, irritated, calloused, or scarred areas. Do not apply the skin patches to your breasts or around the waistline. Use a different site each time to prevent skin irritation. Do not cut or trim the patch. Do not stop using except on the advice of your doctor  or health care professional. Do not wear more than one patch at a time unless you are told to do so by your doctor or health care professional. Contact your pediatrician regarding the use of this medicine in children. Special care may be needed. A patient package insert for the product will be given with each prescription and refill. Read this sheet carefully each time. The sheet may change frequently. Overdosage: If you think you have taken too much of this medicine contact a poison control center or emergency room at once. NOTE: This medicine is only for you. Do not share this medicine with others. What if I miss a dose? If you miss a dose, apply it as soon as you can. If it is almost time for your next dose, apply only that dose. Do not apply double or extra doses. What may interact with this medicine? Do not take this medicine with any of the following medications:  aromatase inhibitors like aminoglutethimide, anastrozole, exemestane, letrozole, testolactone This medicine may also interact with the following medications:  carbamazepine  certain antibiotics used to treat infections  certain barbiturates used for inducing sleep or treating seizures  grapefruit juice  medicines for fungus infections like itraconazole and ketoconazole  raloxifene or tamoxifen  rifabutin, rifampin, or rifapentine  ritonavir  St. John's Wort This list may not describe all possible interactions. Give your health care provider a list of all the medicines, herbs, non-prescription drugs, or dietary supplements you use. Also tell them if you smoke, drink alcohol, or use illegal drugs. Some items may interact with your medicine. What should I watch for while using this  medicine? Visit your doctor or health care professional for regular checks on your progress. You will need a regular breast and pelvic exam and Pap smear while on this medicine. You should also discuss the need for regular mammograms with your  health care professional, and follow his or her guidelines for these tests. This medicine can make your body retain fluid, making your fingers, hands, or ankles swell. Your blood pressure can go up. Contact your doctor or health care professional if you feel you are retaining fluid. If you have any reason to think you are pregnant, stop taking this medicine right away and contact your doctor or health care professional. Smoking increases the risk of getting a blood clot or having a stroke while you are taking this medicine, especially if you are more than 56 years old. You are strongly advised not to smoke. If you wear contact lenses and notice visual changes, or if the lenses begin to feel uncomfortable, consult your eye doctor or health care professional. This medicine can increase the risk of developing a condition (endometrial hyperplasia) that may lead to cancer of the lining of the uterus. Taking progestins, another hormone drug, with this medicine lowers the risk of developing this condition. Therefore, if your uterus has not been removed (by a hysterectomy), your doctor may prescribe a progestin for you to take together with your estrogen. You should know, however, that taking estrogens with progestins may have additional health risks. You should discuss the use of estrogens and progestins with your health care professional to determine the benefits and risks for you. If you are going to have surgery or an MRI, you may need to stop taking this medicine. Consult your health care professional for advice before you schedule the surgery. Contact with water while you are swimming, using a sauna, bathing, or showering may cause the patch to fall off. If your patch falls off reapply it. If you cannot reapply the patch, apply a new patch to another area and continue to follow your usual dose schedule. What side effects may I notice from receiving this medicine? Side effects that you should report to your  doctor or health care professional as soon as possible:  allergic reactions like skin rash, itching or hives, swelling of the face, lips, or tongue  breast tissue changes or discharge  changes in vision  chest pain  confusion, trouble speaking or understanding  dark urine  general ill feeling or flu-like symptoms  light-colored stools  nausea, vomiting  pain, swelling, warmth in the leg  right upper belly pain  severe headaches  shortness of breath  sudden numbness or weakness of the face, arm or leg  trouble walking, dizziness, loss of balance or coordination  unusual vaginal bleeding  yellowing of the eyes or skin Side effects that usually do not require medical attention (report to your doctor or health care professional if they continue or are bothersome):  hair loss  increased hunger or thirst  increased urination  symptoms of vaginal infection like itching, irritation or unusual discharge  unusually weak or tired This list may not describe all possible side effects. Call your doctor for medical advice about side effects. You may report side effects to FDA at 1-800-FDA-1088. Where should I keep my medicine? Keep out of the reach of children. Store at room temperature below 30 degrees C (86 degrees F). Do not store any patches that have been removed from their protective pouch. Throw away any unused medicine after the  expiration date. Dispose of used patches properly. Since used patches may still contain active hormones, fold the patch in half so that it sticks to itself prior to disposal. NOTE: This sheet is a summary. It may not cover all possible information. If you have questions about this medicine, talk to your doctor, pharmacist, or health care provider.  2020 Elsevier/Gold Standard (2016-06-29 12:58:11)

## 2020-10-09 DIAGNOSIS — M9903 Segmental and somatic dysfunction of lumbar region: Secondary | ICD-10-CM | POA: Diagnosis not present

## 2020-10-09 DIAGNOSIS — M9904 Segmental and somatic dysfunction of sacral region: Secondary | ICD-10-CM | POA: Diagnosis not present

## 2020-10-09 DIAGNOSIS — M5116 Intervertebral disc disorders with radiculopathy, lumbar region: Secondary | ICD-10-CM | POA: Diagnosis not present

## 2020-10-09 DIAGNOSIS — M9902 Segmental and somatic dysfunction of thoracic region: Secondary | ICD-10-CM | POA: Diagnosis not present

## 2020-10-16 DIAGNOSIS — R079 Chest pain, unspecified: Secondary | ICD-10-CM | POA: Insufficient documentation

## 2020-10-16 DIAGNOSIS — R002 Palpitations: Secondary | ICD-10-CM | POA: Insufficient documentation

## 2020-10-16 NOTE — Progress Notes (Signed)
Cardiology Office Note   Date:  10/17/2020   ID:  Natasha Villegas, DOB 1966/10/26, MRN [id number]  PCP:  Hulan Fess, MD  Cardiologist:   No primary care provider on file.     Chief Complaint  Patient presents with  . Chest Pain     History of Present Illness: Natasha Villegas is a 54 y.o. female who is referred by Aundria Rud* for evaluation of chest pain and palpitations. The patient reports that she has been getting some chest discomfort. This happens when she walks. She might be walking for about 40 minutes when she starts to get some chest tightness. It can be 8 out of 10 in intensity. She stops and goes away in a few minutes. She is not limited in her walking because of sciatic pain. She does not get this at rest. She has had chest discomfort before with a negative treadmill test in 2017. She does not get associated nausea vomiting or diaphoresis. She does not get any associated shortness of breath, PND or orthopnea. She has no presyncope or syncope. She does get episodes of her heart beating fast sometimes when she gets up. This happens typically when she moves it or do something. It can happen sporadically at rest. It might last for about 45 seconds. She says she just feels her heart beating hard. She is not describing any irregular beat and she has had no frank syncope.    Past Medical History:  Diagnosis Date  . Anemia   . Low vitamin D level 2018  . SVD (spontaneous vaginal delivery)    x 2  . Wears glasses     Past Surgical History:  Procedure Laterality Date  . CYSTOSCOPY N/A 11/05/2014   Procedure: CYSTOSCOPY;  Surgeon: Jamey Reas de Berton Lan, MD;  Location: [facility];  Service: Gynecology;  Laterality: N/A;  . EYE SURGERY  1999   Bilateral Lasik  . GYNECOLOGIC CRYOSURGERY     d/t heavy vaginal discharge  . MASS EXCISION Right 03/21/2014   Procedure: WIDE EXCISION MELANOMA RIGHT LEG;  Surgeon: Imogene Burn. Georgette Dover, MD;  Location: [place];  Service: General;  Laterality: Right;  . ROBOTIC ASSISTED TOTAL HYSTERECTOMY Bilateral 11/05/2014   Procedure: ROBOTIC ASSISTED TOTAL HYSTERECTOMY WITH BILATERAL SALPINGECTOMY ;  Surgeon: Everardo All Amundson de Berton Lan, MD;  Location: [facility];  Service: Gynecology;  Laterality: Bilateral;  . SHOULDER SURGERY Left 10/2015   torn Lt.labrum and bone spurs  . TONSILLECTOMY AND ADENOIDECTOMY    . WISDOM TOOTH EXTRACTION       Current Outpatient Medications  Medication Sig Dispense Refill  . estradiol (MINIVELLE) 0.05 MG/24HR patch Place 1 patch (0.05 mg total) onto the skin 2 (two) times a week. 8 patch 2  . Multiple Vitamin (MULTIVITAMIN) tablet Take 1 tablet by mouth daily.     No current facility-administered medications for this visit.    Allergies:   Patient has no known allergies.    Social History:  The patient  reports that she has never smoked. She has never used smokeless tobacco. She reports that she does not drink alcohol and does not use drugs.   Family History:  The patient's family history includes Cancer (age of onset: 34) in her daughter; Cancer (age of onset: 55) in her mother; Heart attack (age of onset: 71) in her father; Hypertension in her father.    ROS:  Please see the history of present illness.  Otherwise, review of systems are positive for none.   All other systems are reviewed and negative.    PHYSICAL EXAM: VS:  BP (!) 157/94   Pulse 82   Ht 5\' 6"  (1.676 m)   Wt 150 lb 9.6 oz (68.3 kg)   LMP 10/14/2014 (Approximate)   SpO2 99%   BMI 24.31 kg/m  , BMI Body mass index is 24.31 kg/m. GENERAL:  Well appearing HEENT:  Pupils equal round and reactive, fundi not visualized, oral mucosa unremarkable NECK:  No jugular venous distention, waveform within normal limits, carotid upstroke brisk and symmetric, no bruits, no thyromegaly LYMPHATICS:  No cervical, inguinal adenopathy LUNGS:  Clear to auscultation bilaterally BACK:  No CVA  tenderness CHEST:  Unremarkable HEART:  PMI not displaced or sustained,S1 and S2 within normal limits, no S3, no S4, no clicks, no rubs, no murmurs ABD:  Flat, positive bowel sounds normal in frequency in pitch, no bruits, no rebound, no guarding, no midline pulsatile mass, no hepatomegaly, no splenomegaly EXT:  2 plus pulses throughout, no edema, no cyanosis no clubbing SKIN:  No rashes no nodules NEURO:  Cranial nerves II through XII grossly intact, motor grossly intact throughout PSYCH:  Cognitively intact, oriented to person place and time    EKG:  EKG is ordered today. The ekg ordered today demonstrates sinus rhythm, rate 82, axis within normal limits, intervals within normal limits, no acute ST-T wave changes.   Recent Labs: 06/11/2020: ALT 36; BUN 11; Creatinine, Ser 0.74; Hemoglobin 12.1; Platelets 468; Potassium 4.5; Sodium 142    Lipid Panel    Component Value Date/Time   CHOL 218 (H) 06/11/2020 1005   TRIG 156 (H) 06/11/2020 1005   HDL 64 06/11/2020 1005   CHOLHDL 3.4 06/11/2020 1005   CHOLHDL 2.7 04/11/2017 1037   VLDL 14 04/11/2017 1037   LDLCALC 127 (H) 06/11/2020 1005      Wt Readings from Last 3 Encounters:  10/17/20 150 lb 9.6 oz (68.3 kg)  06/11/20 149 lb (67.6 kg)  05/12/20 147 lb (66.7 kg)      Other studies Reviewed: Additional studies/ records that were reviewed today include: Labs. Review of the above records demonstrates:  Please see elsewhere in the note.     ASSESSMENT AND PLAN:  PALPITATIONS: Patient is having some palpitations and I will check to see if she has any rhythm disturbance with exercise. If this persists she should probably get home device such as an Alive cor or an apple watch. I do see that she has had normal electrolytes and TSH although I do not see a recent TSH. If this persists this could be followed by her primary provider. At this point no further evaluation.  CEST PAIN: The patient has significant risk factors with family  history. I am going to do a coronary calcium score and a POET (Plain Old Exercise Treadmill). Further testing will be based on this. Goals of therapy will be based on this.  DYSLIPIDEMIA: We had a long discussion about diet and exercise. The goal of her LDL will be based on the above testing. She would like to avoid a statin.  HTN: Her blood pressure is elevated. This is not typical for her but she is not been checking it recently and she will at home. I suspect she might need a blood pressure medication. She is committed to losing weight that she has gained.  Current medicines are reviewed at length with the patient today.  The patient does not  have concerns regarding medicines.  The following changes have been made:  no change  Labs/ tests ordered today include:   Orders Placed This Encounter  Procedures  . CT CARDIAC SCORING  . Exercise Tolerance Test  . EKG 12-Lead     Disposition:   FU with as needed based on the results of the above.   Signed, Minus Breeding, MD  10/17/2020 10:40 AM    [facility]

## 2020-10-17 ENCOUNTER — Encounter: Payer: Self-pay | Admitting: Cardiology

## 2020-10-17 ENCOUNTER — Ambulatory Visit: Payer: BC Managed Care – PPO | Admitting: Cardiology

## 2020-10-17 ENCOUNTER — Other Ambulatory Visit: Payer: Self-pay

## 2020-10-17 VITALS — BP 157/94 | HR 82 | Ht 66.0 in | Wt 150.6 lb

## 2020-10-17 DIAGNOSIS — R002 Palpitations: Secondary | ICD-10-CM | POA: Diagnosis not present

## 2020-10-17 DIAGNOSIS — R079 Chest pain, unspecified: Secondary | ICD-10-CM

## 2020-10-17 NOTE — Patient Instructions (Signed)
Medication Instructions:  None *If you need a refill on your cardiac medications before your next appointment, please call your pharmacy*   Lab Work: None If you have labs (blood work) drawn today and your tests are completely normal, you will receive your results only by: Marland Kitchen MyChart Message (if you have MyChart) OR . A paper copy in the mail If you have any lab test that is abnormal or we need to change your treatment, we will call you to review the results.   Testing/Procedures: Your physician has requested that you have an exercise tolerance test. For further information please visit [email or web address]. Please also follow instruction sheet, as given. This will take place at [address], [address].  Do not drink or eat foods with caffeine for 24 hours before the test. (Chocolate, coffee, tea, or energy drinks)  If you use an inhaler, bring it with you to the test.  Do not smoke for 4 hours before the test.  Wear comfortable shoes and clothing.  You will need to have the coronavirus test completed prior to your procedure. This is a Drive Up Visit at the [address]. Please tell them that you are there for pre-procedure testing. Someone will direct you to the appropriate testing line. Stay in your car and someone will be with you shortly. Please make sure to have all other labs completed before this test because you will need to stay quarantined until your procedure. Please take your insurance card to this test.   Your physician has requested that you have a Coronary Calcium Score. Please see information about this test below.   Follow-Up: At [facility], you and your health needs are our priority.  As part of our continuing mission to provide you with exceptional heart care, we have created designated Provider Care Teams.  These Care Teams include your primary Cardiologist (physician) and Advanced Practice Providers (APPs -  Physician Assistants and  Nurse Practitioners) who all work together to provide you with the care you need, when you need it.  We recommend signing up for the patient portal called "MyChart".  Sign up information is provided on this After Visit Summary.  MyChart is used to connect with patients for Virtual Visits (Telemedicine).  Patients are able to view lab/test results, encounter notes, upcoming appointments, etc.  Non-urgent messages can be sent to your provider as well.   To learn more about what you can do with MyChart, go to [email or web address].    Your next appointment:   Follow up with Dr. Minus Breeding, MD as needed  Other Instructions Dr. Percival Spanish has ordered a CT coronary calcium score. This test is done at [address] on the 3rd Floor. There is an $150 out-of-pocket charge for this test.  Coronary Calcium Scan A coronary calcium scan is an imaging test used to look for deposits of calcium and other fatty materials (plaques) in the inner lining of the blood vessels of the heart (coronary arteries). These deposits of calcium and plaques can partly clog and narrow the coronary arteries without producing any symptoms or warning signs. This puts a person at risk for a heart attack. This test can detect these deposits before symptoms develop.  Tell a health care provider about: . Any allergies you have . All medicines you are taking, including vitamins, herbs, eye drops, creams, and over-the-counter medicines. . Any problems you or family members have had with anesthetic medicines. . Any blood disorders you have. Marland Kitchen  Any surgeries you have had. . Any medical conditions you have. . Whether you are pregnant or may be pregnant.  What are the risks? Generally, this is a safe procedure. However, problems may occur, including: . Harm to a pregnant woman and her unborn baby. This test involves the use of radiation. Radiation exposure can be dangerous to a pregnant woman and her unborn baby. If you are  pregnant, you generally should not have this procedure done. . Slight increase in the risk of cancer. This is because of the radiation involved in the test.  What happens before the procedure? No preparation is needed for this procedure.  What happens during the procedure? Marland Kitchen You will undress and remove any jewelry around your neck or chest. . You will put on a hospital gown. . Sticky electrodes will be placed on your chest. The electrodes will be connected to an electrocardiogram (ECG) machine to record a tracing of the electrical activity of your heart. . A CT scanner will take pictures of your heart. During this time, you will be asked to lie still and hold your breath for 2-3 seconds while a picture of your heart is being taken. The procedure may vary among health care providers and hospitals.  What happens after the procedure? You can get dressed. You can return to your normal activities. It is up to you to get the results of your test. Ask your health care provider, or the department that is doing the test, when your results will be ready.  Summary . A coronary calcium scan is an imaging test used to look for deposits of calcium and other fatty materials (plaques) in the inner lining of the blood vessels of the heart (coronary arteries). Leodis Sias, this is a safe procedure. Tell your health care provider if you are pregnant or may be pregnant. . No preparation is needed for this procedure. . A CT scanner will take pictures of your heart. . You can return to your normal activities after the scan is done. . This information is not intended to replace advice given to you by your health care provider. Make sure you discuss any questions you have with your health care provider.

## 2020-10-20 ENCOUNTER — Telehealth (HOSPITAL_COMMUNITY): Payer: Self-pay | Admitting: Cardiology

## 2020-10-20 NOTE — Telephone Encounter (Signed)
Patient declined to schedule GXT with K Fogleman at [place] office due to she is not willing to do do COVID Protocol.  She will call back at a later date to reschedule. Order will be removed from the [facility] and when patient calls back we will reinstate the order.

## 2020-11-05 DIAGNOSIS — Z20822 Contact with and (suspected) exposure to covid-19: Secondary | ICD-10-CM | POA: Diagnosis not present

## 2020-11-05 DIAGNOSIS — Z03818 Encounter for observation for suspected exposure to other biological agents ruled out: Secondary | ICD-10-CM | POA: Diagnosis not present

## 2020-11-06 ENCOUNTER — Other Ambulatory Visit: Payer: Self-pay

## 2020-11-06 ENCOUNTER — Ambulatory Visit (INDEPENDENT_AMBULATORY_CARE_PROVIDER_SITE_OTHER)
Admission: RE | Admit: 2020-11-06 | Discharge: 2020-11-06 | Disposition: A | Discharge status: Home / Self Care | Payer: Self-pay | Source: Ambulatory Visit | Attending: Cardiology | Admitting: Cardiology

## 2020-11-06 DIAGNOSIS — R002 Palpitations: Secondary | ICD-10-CM

## 2020-11-06 DIAGNOSIS — R079 Chest pain, unspecified: Secondary | ICD-10-CM

## 2020-12-06 ENCOUNTER — Telehealth: Payer: Self-pay | Admitting: Obstetrics and Gynecology

## 2020-12-08 NOTE — Telephone Encounter (Signed)
Medication refill request: Estradiol Last AEX:  06/11/20 Dr. Quincy Simmonds Next AEX: 07/08/21 Last MMG (if hormonal medication request): 03/13/20 BIRADS 1 negative/density b Refill authorized: today, please advise   Tried calling patient to get an update on medication. No answer, left message for patient to call me back. Does patient need a follow-up appointment? Please advise

## 2020-12-09 NOTE — Telephone Encounter (Signed)
Patient is returning call for [facility]. Patient states to call mobile number.

## 2020-12-09 NOTE — Telephone Encounter (Signed)
Spoke with patient and patient states that she is doing great with the Estradiol patches with no side effects. Advised patient that refills were sent to the pharmacy. Patient verbalizes understanding. Closing encounter.

## 2021-03-01 ENCOUNTER — Other Ambulatory Visit: Payer: Self-pay | Admitting: Obstetrics and Gynecology

## 2021-03-02 NOTE — Telephone Encounter (Signed)
AEX scheduled 07/08/21 Last AEX 06/11/20  09/18/20 you counseled her at visit "We discussed estrogen therapy versus SSRI, Paxil.  Risks of benefits reviewed.  She chooses transdermal estrogen, Vivelle Dot 0.05 mg twice weekly. WHI discussed and risk of stroke, DVT, and PE.  She is instructed to stop the patch if she has an increase in her cardiac symptoms. Will refer back to cardiology. Fu in 2 - 3 months."  Requesting refill.

## 2021-05-21 DIAGNOSIS — Z20822 Contact with and (suspected) exposure to covid-19: Secondary | ICD-10-CM | POA: Diagnosis not present

## 2021-06-29 ENCOUNTER — Other Ambulatory Visit: Payer: Self-pay | Admitting: Obstetrics and Gynecology

## 2021-06-29 NOTE — Telephone Encounter (Signed)
Annual exam scheduled on 07/08/21

## 2021-07-02 NOTE — Progress Notes (Signed)
55 y.o. G2P2002 Married Caucasian female here for annual exam.    Doing great with her estradiol. Hot flashes are controlled.  Daughter expecting her first baby.   PCP:  None   Patient's last menstrual period was 10/14/2014 (approximate).           Sexually active: Yes.    The current method of family planning is status post hysterectomy.    Exercising: No.   Walking the dog    Smoker:  no  Health Maintenance: Pap:  02-20-14 Neg:Neg HR HPV; 12-23-10 Neg History of abnormal Pap:  no MMG: 03-13-20 3D/Neg/BiRads1--knows to schedule Colonoscopy: 08/14/18 internal hemorrhoids; f/u 10 years BMD:  n/a  Result  n/a TDaP: 2012--would like today Gardasil:   no HBZ:JIRCVEL blood Hep C:donated blood Screening Labs:  today.   reports that she has never smoked. She has never used smokeless tobacco. She reports that she does not drink alcohol and does not use drugs.  Past Medical History:  Diagnosis Date   Anemia    Low vitamin D level 2018   SVD (spontaneous vaginal delivery)    x 2   Wears glasses     Past Surgical History:  Procedure Laterality Date   CYSTOSCOPY N/A 11/05/2014   Procedure: CYSTOSCOPY;  Surgeon: Jamey Reas de Berton Lan, MD;  Location: [facility];  Service: Gynecology;  Laterality: N/A;   EYE SURGERY  1999   Bilateral Lasik   GYNECOLOGIC CRYOSURGERY     d/t heavy vaginal discharge   MASS EXCISION Right 03/21/2014   Procedure: WIDE EXCISION MELANOMA RIGHT LEG;  Surgeon: Imogene Burn. Georgette Dover, MD;  Location: [facility];  Service: General;  Laterality: Right;   ROBOTIC ASSISTED TOTAL HYSTERECTOMY Bilateral 11/05/2014   Procedure: ROBOTIC ASSISTED TOTAL HYSTERECTOMY WITH BILATERAL SALPINGECTOMY ;  Surgeon: Jamey Reas de Berton Lan, MD;  Location: [facility];  Service: Gynecology;  Laterality: Bilateral;   SHOULDER SURGERY Left 10/2015   torn Lt.labrum and bone spurs   TONSILLECTOMY AND ADENOIDECTOMY     WISDOM TOOTH EXTRACTION      Current  Outpatient Medications  Medication Sig Dispense Refill   estradiol (VIVELLE-DOT) 0.05 MG/24HR patch PLACE 1 PATCH (0.05 MG TOTAL) ONTO THE SKIN 2 (TWO) TIMES A WEEK. 8 patch 0   Multiple Vitamin (MULTIVITAMIN) tablet Take 1 tablet by mouth daily.     No current facility-administered medications for this visit.    Family History  Problem Relation Age of Onset   Hypertension Father    Heart attack Father 10       bypass surgery   Cancer Mother 27       Dec--bone CA   Cancer Daughter 24       pancreatic cancer   Colon cancer Neg Hx    Esophageal cancer Neg Hx    Stomach cancer Neg Hx    Rectal cancer Neg Hx     Review of Systems  All other systems reviewed and are negative.  Exam:   BP (!) 160/78   Pulse 90   Ht 5' 4.5" (1.638 m)   Wt 150 lb (68 kg)   LMP 10/14/2014 (Approximate)   SpO2 98%   BMI 25.35 kg/m     General appearance: alert, cooperative and appears stated age Head: normocephalic, without obvious abnormality, atraumatic Neck: no adenopathy, supple, symmetrical, trachea midline and thyroid normal to inspection and palpation Lungs: clear to auscultation bilaterally Breasts: normal appearance, no masses or tenderness, No nipple retraction  or dimpling, No nipple discharge or bleeding, No axillary adenopathy Heart: regular rate and rhythm Abdomen: soft, non-tender; no masses, no organomegaly Extremities: extremities normal, atraumatic, no cyanosis or edema Skin: skin color, texture, turgor normal. No rashes or lesions Lymph nodes: cervical, supraclavicular, and axillary nodes normal. Neurologic: grossly normal  Pelvic: External genitalia:  no lesions              No abnormal inguinal nodes palpated.              Urethra:  normal appearing urethra with no masses, tenderness or lesions              Bartholins and Skenes: normal                 Vagina: normal appearing vagina with normal color and discharge, no lesions              Cervix:  absent               Pap taken: no Bimanual Exam:  Uterus: absent              Adnexa: no mass, fullness, tenderness              Rectal exam: Yes.  .  Confirms.              Anus:  normal sphincter tone, no lesions  Chaperone was present for exam:  Estill Bamberg, CMA  Assessment:   Well woman visit with normal exam. Status post robotic TLH and bilateral salpingectomy. ERT patient.  Hx melanoma.   Plan: Mammogram screening discussed. Self breast awareness reviewed. Pap and HR HPV as above. Guidelines for Calcium, Vitamin D, regular exercise program including cardiovascular and weight bearing exercise. We discussed WHI and risk of stroke, DVT, and PE with ERT.  She will continue her patch, Vivelle Dot 0.05 mg twice weekly.  I have given her 3 months of the prescription so she can update her mammogram.  If mammogram normal, will give refills until next annual exam.  TDap. Routine labs. She will monitor her blood work at home.  She will make an appointment for a skin check.  Follow up annually and prn.    After visit summary provided.

## 2021-07-08 ENCOUNTER — Other Ambulatory Visit: Payer: Self-pay

## 2021-07-08 ENCOUNTER — Encounter: Payer: Self-pay | Admitting: Obstetrics and Gynecology

## 2021-07-08 ENCOUNTER — Other Ambulatory Visit: Payer: Self-pay | Admitting: Obstetrics and Gynecology

## 2021-07-08 ENCOUNTER — Ambulatory Visit (INDEPENDENT_AMBULATORY_CARE_PROVIDER_SITE_OTHER): Payer: BC Managed Care – PPO | Admitting: Obstetrics and Gynecology

## 2021-07-08 VITALS — BP 160/78 | HR 90 | Ht 64.5 in | Wt 150.0 lb

## 2021-07-08 DIAGNOSIS — Z01419 Encounter for gynecological examination (general) (routine) without abnormal findings: Secondary | ICD-10-CM | POA: Diagnosis not present

## 2021-07-08 DIAGNOSIS — Z23 Encounter for immunization: Secondary | ICD-10-CM | POA: Diagnosis not present

## 2021-07-08 DIAGNOSIS — Z1231 Encounter for screening mammogram for malignant neoplasm of breast: Secondary | ICD-10-CM

## 2021-07-08 LAB — COMPREHENSIVE METABOLIC PANEL
AG Ratio: 1.9 (calc) (ref 1.0–2.5)
ALT: 14 U/L (ref 6–29)
AST: 17 U/L (ref 10–35)
Albumin: 4.4 g/dL (ref 3.6–5.1)
Alkaline phosphatase (APISO): 65 U/L (ref 37–153)
BUN: 16 mg/dL (ref 7–25)
CO2: 25 mmol/L (ref 20–32)
Calcium: 9 mg/dL (ref 8.6–10.4)
Chloride: 104 mmol/L (ref 98–110)
Creat: 0.74 mg/dL (ref 0.50–1.03)
Globulin: 2.3 g/dL (calc) (ref 1.9–3.7)
Glucose, Bld: 83 mg/dL (ref 65–99)
Potassium: 4.3 mmol/L (ref 3.5–5.3)
Sodium: 138 mmol/L (ref 135–146)
Total Bilirubin: 0.3 mg/dL (ref 0.2–1.2)
Total Protein: 6.7 g/dL (ref 6.1–8.1)

## 2021-07-08 LAB — CBC
HCT: 36.8 % (ref 35.0–45.0)
Hemoglobin: 11.3 g/dL — ABNORMAL LOW (ref 11.7–15.5)
MCH: 25.3 pg — ABNORMAL LOW (ref 27.0–33.0)
MCHC: 30.7 g/dL — ABNORMAL LOW (ref 32.0–36.0)
MCV: 82.5 fL (ref 80.0–100.0)
MPV: 11.1 fL (ref 7.5–12.5)
Platelets: 366 10*3/uL (ref 140–400)
RBC: 4.46 10*6/uL (ref 3.80–5.10)
RDW: 15.7 % — ABNORMAL HIGH (ref 11.0–15.0)
WBC: 6.6 10*3/uL (ref 3.8–10.8)

## 2021-07-08 LAB — LIPID PANEL
Cholesterol: 239 mg/dL — ABNORMAL HIGH (ref <200)
HDL: 70 mg/dL (ref >=50)
LDL Cholesterol (Calc): 144 mg/dL (calc) — ABNORMAL HIGH
Non-HDL Cholesterol (Calc): 169 mg/dL (calc) — ABNORMAL HIGH (ref <130)
Total CHOL/HDL Ratio: 3.4 (calc) (ref <5.0)
Triglycerides: 129 mg/dL (ref <150)

## 2021-07-08 LAB — VITAMIN D 25 HYDROXY (VIT D DEFICIENCY, FRACTURES): Vit D, 25-Hydroxy: 36 ng/mL (ref 30–100)

## 2021-07-08 MED ORDER — ESTRADIOL 0.05 MG/24HR TD PTTW: 1.0000 | MEDICATED_PATCH | TRANSDERMAL | 0 refills | Status: DC

## 2021-07-08 NOTE — Patient Instructions (Signed)

## 2021-07-10 ENCOUNTER — Ambulatory Visit
Admission: RE | Admit: 2021-07-10 | Discharge: 2021-07-10 | Disposition: A | Discharge status: Home / Self Care | Payer: BC Managed Care – PPO | Source: Ambulatory Visit

## 2021-07-10 ENCOUNTER — Other Ambulatory Visit: Payer: Self-pay

## 2021-07-10 DIAGNOSIS — Z1231 Encounter for screening mammogram for malignant neoplasm of breast: Secondary | ICD-10-CM | POA: Diagnosis not present

## 2021-08-12 DIAGNOSIS — M25551 Pain in right hip: Secondary | ICD-10-CM | POA: Diagnosis not present

## 2021-08-12 DIAGNOSIS — R030 Elevated blood-pressure reading, without diagnosis of hypertension: Secondary | ICD-10-CM | POA: Diagnosis not present

## 2021-08-21 ENCOUNTER — Other Ambulatory Visit: Payer: Self-pay | Admitting: Gastroenterology

## 2021-10-21 ENCOUNTER — Telehealth: Payer: Self-pay | Admitting: *Deleted

## 2021-10-21 MED ORDER — ESTRADIOL 0.05 MG/24HR TD PTTW
1.0000 | MEDICATED_PATCH | TRANSDERMAL | 2 refills | Status: DC
Start: 2021-10-22 — End: 2022-06-23

## 2021-10-21 NOTE — Telephone Encounter (Signed)
Patient called requesting refill on vivlle dot patch 0.05 mg patch. Last annual exam was 06/2021, last mammogram was 07/10/21.  Per note on 07/08/21 annual exam "If mammogram normal, will give refills until next annual exam"  Mammogram was normal refill sent.   Dr.Silva just wanted you to know I sent in refill for patient, her mammogram was normal.

## 2021-10-22 NOTE — Telephone Encounter (Signed)
Encounter reviewed and closed.  Thank you.

## 2021-11-16 DIAGNOSIS — L814 Other melanin hyperpigmentation: Secondary | ICD-10-CM | POA: Diagnosis not present

## 2021-11-16 DIAGNOSIS — D485 Neoplasm of uncertain behavior of skin: Secondary | ICD-10-CM | POA: Diagnosis not present

## 2021-11-16 DIAGNOSIS — L989 Disorder of the skin and subcutaneous tissue, unspecified: Secondary | ICD-10-CM | POA: Diagnosis not present

## 2021-11-16 DIAGNOSIS — L821 Other seborrheic keratosis: Secondary | ICD-10-CM | POA: Diagnosis not present

## 2021-11-16 DIAGNOSIS — L812 Freckles: Secondary | ICD-10-CM | POA: Diagnosis not present

## 2021-11-16 DIAGNOSIS — D225 Melanocytic nevi of trunk: Secondary | ICD-10-CM | POA: Diagnosis not present

## 2022-02-03 DIAGNOSIS — D492 Neoplasm of unspecified behavior of bone, soft tissue, and skin: Secondary | ICD-10-CM | POA: Diagnosis not present

## 2022-03-30 ENCOUNTER — Telehealth: Payer: Self-pay | Admitting: Primary Care

## 2022-03-30 ENCOUNTER — Ambulatory Visit: Payer: BLUE CROSS/BLUE SHIELD | Admitting: Primary Care

## 2022-03-30 NOTE — Telephone Encounter (Signed)
Call from patient concerned they may have some type of respiratory infection.  Symptoms began about 3 weeks ago.    Symptoms to include: nasal congestion, chest congestion, productive cough - mucus yellow to dark grey in color depends on the day, SOB, slight fatigue.    Denies the following symptoms: Fever, sore throat, headaches, BM changes, nausea, vomiting, body aches, chills.    Covid Tested? - NO    COVID Vaccinated?- YES  Flu Vaccinated? - NO    Treating with: Dayquil - didn't help.    Requesting the following: appointment - scheduled for 4pm today with JB.

## 2022-04-02 NOTE — Telephone Encounter (Signed)
Call from Keyleen this morning stating "I am surprised that I have not heard from Dr. Mliss Fritz yet."  I asked her what this was related to and she said she would just like to speak to Dr. Mliss Fritz about her appointment on Wednesday.  I explained generally the nurses call patients back and then they'd discuss with the provider and she said "I need a personal call from Dr. Mliss Fritz."  I told her he was out until Monday.    After talking with Elon Jester and doing a little more digging in the patients chart - Railey had called Wednesday with URI symptoms ongoing 3 weeks, she requested an appointment and was scheduled for 4pm with JB that day.  At 4:26 she came out and asked if Dr. Mliss Fritz was behind, someone had explained he'd be right in as he was just finishing up with another patient.  I remember she replied with "It's fine, I don't really need this appointment anyways I am feeling a little better now, I don't want to waste his time."  Her appointment was canceled by Elon Jester as soon as she left - 4:27pm.

## 2022-04-02 NOTE — Telephone Encounter (Signed)
I called Carrie Munoz and had to leave a voicemail, I explained that unfortunately once an appt is cancelled it disappears from the doctors schedule, so Dr Mliss Fritz may not have been aware of the appt or that it was cancelled.    I also reviewed in the voicemail that she had said she was fine and not even sure why she came in for the appt as she was leaving.  She had waited approx 25 minutes in the exam room at that time.  She then left.  I asked her to call back to let us know how she is feeling

## 2022-04-02 NOTE — Telephone Encounter (Signed)
Call back from Maebrie very upset that she called shortly after 8 o'clock this morning and did not hear back from anyone in the office until after 2:30pm.      I said I do apologize Elon Jester just left but I would transfer you over to Rosanne so she can speak with you.  She said "no I just want to leave another message asking Dr. Mliss Fritz to call me back."  I said well it does look like the nurse wanted to speak with you so let me get you transferred over.  She said I don't think you are listening to me "I asked to speak with Dr. Mliss Fritz please have him call me back."  I asked is this about not being seen last Wednesday because I did do a little research after getting off the phone with you because you had stated you wanted to speak with Dr. Mliss Fritz about your appointment and I didn't see a note from your visit.  Elon Jester was reaching out to check up on you as we assumed it had something to do with this.  She said I would like to speak to Dr. Mliss Fritz as it appears no one in your office seems to care about the patients there.  I said no that is not the case at all, I want to explain what the nurse was reaching out about.  She continued to talk over me and said "no one cares about the patients, not one person came in and checked on me at all while I waited in the room or even updated me."  She said "I peaked my head out at 15 after and looked around, people looked at me and completely ignored me."  I said I do apologize but as I was saying, she still yelled over top of me.  I asked that she give me a chance to explain, she continued to ignore me and I said ma'm I would like a moment to talk with you I do understand your frustration but I was speaking and you continue to talk over me so if this is the way the conversation is going to go I will need to hang the phone up and send this on to my Engineer, manufacturing.    At this point Rosanne came over to try and help me.   Marabella said "go ahead and finish then."  So I felt I could continue the  conversation, I stated after speaking with you this morning I was looking at your appointment book it looks as though you had left without being seen due to "Patient (decided she didnt need appt and left. said she felt okay)" - as stated in the appointment cancellation comment by Elon Jester.  I said we do remember that you had stepped out asking if Dr. Mliss Fritz was behind that was at 4:25/4:26ish and we had said he was just finishing up with a patient and would be in shortly as he was a little behind.  In which the response was "It's fine, I don't really need this appointment anyways.  I am feeling a little better now and don't want to waste his time."  She responded with "I absolutely did not say that at all, you are a liar and obviously don't care about me."  Rosanne and Florentina Addison did hear me and said I do remember her saying that, when I had told Elon Jester about this in the morning she distinctly remembers her saying this as well.  I explained to the  patient that this is what you said as it was documented as you were saying it (the appointment was canceled at 4:27 and she was just walking out the side door at that time.)  I said some of the clinicians said they heard this as well so I do apologize that we did not follow back up.    I was going to continue to explain that when she is taken off the schedule Dr. Mliss Fritz will no longer see the appointment and that may be why no one followed up but she just did not give me the opportunity.  She continued to call me a liar and said we are ganging up on her and not taking her seriously she asked that Dr. Mliss Fritz call her back.  I said I would forward this on to Dr. Mliss Fritz as well as my Engineer, manufacturing and she will most likely reach out next week after her vacation.

## 2022-04-05 ENCOUNTER — Telehealth: Payer: Self-pay | Admitting: Primary Care

## 2022-04-05 NOTE — Telephone Encounter (Signed)
Eeva called back again today and requested to speak directly with Dr. Mliss Fritz.

## 2022-04-05 NOTE — Telephone Encounter (Signed)
03/30/22 office visit was cancelled when patient declined wait while I was running behind.  See subsequent exchanges with office staff, request for phone call while I was out of the office, no details shared.    5+ weeks as of today, secretions less thick and discolored, less in chest and more congestion now in nose, ears.  Better over the weekend.    I apologized for her wait last week, and offered the following suggestions:    Fluids, nasal rinses, expectorant without decongestant.  To be in touch with lingering sx of concern.    She thanked me for the call, understood what we are up against right now as a primary care practice.

## 2022-04-05 NOTE — Telephone Encounter (Signed)
Called Kathrine at 12:43 and transferred to [facility].

## 2022-04-05 NOTE — Telephone Encounter (Signed)
Carrie Munoz is in agreement to speak at 12:45.  I will get her on the phone then.

## 2022-04-08 ENCOUNTER — Other Ambulatory Visit: Payer: Self-pay

## 2022-04-08 ENCOUNTER — Encounter: Payer: Self-pay | Admitting: Physician Assistant

## 2022-04-08 ENCOUNTER — Ambulatory Visit: Payer: BLUE CROSS/BLUE SHIELD | Admitting: Physician Assistant

## 2022-04-08 VITALS — BP 123/72 | HR 78 | Temp 97.9°F | Resp 18

## 2022-04-08 DIAGNOSIS — M20019 Mallet finger of unspecified finger(s): Secondary | ICD-10-CM

## 2022-04-08 DIAGNOSIS — M20012 Mallet finger of left finger(s): Secondary | ICD-10-CM

## 2022-04-08 NOTE — UC Provider Note (Signed)
History     Chief Complaint   Patient presents with   . Finger Injury     Patient states they jammed their left ring finger this past Tuesday while playing volleyball.  States is using duct tape to splint it since tip of finger stays bent.  Has full ROM and minimal pain with use.  Denies any numbness or tingling.     Carrie Munoz is 56 y.o. year old female with past medical history as stated below, [facility] concern for left ring finger injury.  Patient reports that while playing volleyball 2 days ago, she jammed her finger into the ball.  She felt a pop.  She states that she has full range of motion and minimal pain.  Does report some swelling to the middle phalanx and over the DIP joint of her left ring finger.  She is left-hand dominant.  Thinks that she has had a sprain in this finger in the past.  Has been using a splint for which she has made out of duct tape and toothpicks.  No fevers or chills.  Denies any weakness.  Has not yet any over-the-counter analgesia.    History provided by:  Patient  Language interpreter used: No        Medical/Surgical/Family History     History reviewed. No pertinent past medical history.     Patient Active Problem List   Diagnosis Code   . Migraine headache without aura G43.009            Past Surgical History:   Procedure Laterality Date   . CONVERTED PROCEDURE      Open Treat Weight Bearing Distal Tibial & Fibular Fracture Conversion Data      Family History   Problem Relation Age of Onset   . High Blood Pressure Mother    . Diabetes Mother    . High Blood Pressure Father    . Thyroid disease Father         Allergenthyroidhypo   . Prostate cancer Father    . Diabetes Maternal Aunt           Social History     Tobacco Use   . Smoking status: Former     Packs/day: 1.00     Years: 4.00     Pack years: 4.00     Types: Cigarettes     Quit date: 12/20/1981     Years since quitting: 40.3   . Smokeless tobacco: Never   Substance Use Topics   . Alcohol use: No   .  Drug use: No     Living Situation     Questions Responses    Patient lives with     Homeless     Caregiver for other family member     External Services     Employment     Domestic Violence Risk                 Review of Systems   See HPI    Physical Exam   Vitals     First Recorded BP: 123/72, Resp: 18, Temp: 36.6 C (97.9 F) Oxygen Therapy SpO2: 96 %, Heart Rate: 78, (04/08/22 0922)  .      Physical Exam  Vitals and nursing note reviewed.   Constitutional:       General: She is not in acute distress.     Appearance: Normal appearance. She is not ill-appearing, toxic-appearing or diaphoretic.  HENT:      Head: Normocephalic.   Eyes:      General: No scleral icterus.  Cardiovascular:      Pulses: Normal pulses.   Pulmonary:      Effort: Pulmonary effort is normal. No respiratory distress.   Musculoskeletal:      Comments: Left ring finger: minimal pain in middle phalanx and over DIP joint.  She has loss of extension at the DIP joint.  She does have full flexion/extension at the fourth MCP and fourth PIP joint.    Skin:     General: Skin is warm and dry.      Capillary Refill: Capillary refill takes less than 2 seconds.      Findings: No bruising, erythema or rash.   Neurological:      Mental Status: She is alert and oriented to person, place, and time.      Sensory: No sensory deficit.      Motor: Weakness (left ring finger DIP joint) present.   Psychiatric:         Mood and Affect: Mood normal.         Behavior: Behavior normal.         Thought Content: Thought content normal.         Judgment: Judgment normal.          Medical Decision Making   Medical Decision Making  Assessment:    Carrie Munoz is a 56 y.o. female presents urgent care today with concern for left ring finger injury after she jammed her finger in a volleyball 3 days ago.  Now having loss of extension at the fourth DIP joint concerning for mallet finger.  She has full range of motion and minimal pain.  She is neurovascular intact distally.   Suspect that this is a mallet finger.    Differential diagnosis:    Contusion  Phalanx Fracture  Phalanx Dislocation  Metacarpal Fracture  Ligamentous Sprain  Tendon Sprain  Mallet Finger      Plan and Results:    Imaging not available at [facility]. Discussed going to another UC for imaging. However, currently management will not be changed if fx vs no fx. Suspect mallet finger. Will splint 4th DIP joint in extension with finger splint and refer to ortho.   Encounter orders    Orders Placed This Encounter      ED/UC REFERRAL TO ORTHO      Application finger splint static        Independent Review of: existing labs/imaging and chart/prior records    Diagnosis and Disposition:   splinting is performed for six to eight weeks.   Please use splint provided to you.   The DIP joint must be maintained in full extension throughout the entire period, including during sleep. Adherence to this instruction is essential.   Whenever the splint is removed (eg, to clean the finger or change the splint), the patient must support the distal fingertip in full extension at all times. Should DIP joint extension be lost at any point during the initial treatment period, the treatment clock is reset and an additional six weeks of splinting must be performed.   Please follow up with ortho. A referral has been placed    Return precautions discussed and provided on AVS.    Discharge Medications          Follow-up  Follow up in about 1 week (around 04/15/2022) for Ortho Evaluation.  Final Diagnosis    ICD-10-CM ICD-9-CM   1. Mallet finger, unspecified laterality  M20.019 736.1         Loman Brooklyn, PA

## 2022-04-08 NOTE — Patient Instructions (Signed)
splinting is performed for six to eight weeks.   Please use splint provided to you.   The DIP joint must be maintained in full extension throughout the entire period, including during sleep. Adherence to this instruction is essential.   Whenever the splint is removed (eg, to clean the finger or change the splint), the patient must support the distal fingertip in full extension at all times. Should DIP joint extension be lost at any point during the initial treatment period, the treatment clock is reset and an additional six weeks of splinting must be performed.   Please follow up with ortho. A referral has been placed

## 2022-04-20 ENCOUNTER — Encounter: Payer: Self-pay | Admitting: Physician Assistant

## 2022-04-20 ENCOUNTER — Other Ambulatory Visit: Payer: Self-pay

## 2022-04-20 ENCOUNTER — Ambulatory Visit
Payer: BLUE CROSS/BLUE SHIELD | Attending: Physician Assistant | Admitting: Rehabilitative and Restorative Service Providers"

## 2022-04-20 ENCOUNTER — Ambulatory Visit
Admission: RE | Admit: 2022-04-20 | Discharge: 2022-04-20 | Disposition: A | Discharge status: Home / Self Care | Payer: BLUE CROSS/BLUE SHIELD | Source: Ambulatory Visit

## 2022-04-20 ENCOUNTER — Ambulatory Visit: Payer: BLUE CROSS/BLUE SHIELD | Attending: Physician Assistant | Admitting: Physician Assistant

## 2022-04-20 VITALS — BP 115/65 | HR 76 | Ht 62.0 in | Wt 150.0 lb

## 2022-04-20 DIAGNOSIS — S62635A Displaced fracture of distal phalanx of left ring finger, initial encounter for closed fracture: Secondary | ICD-10-CM | POA: Insufficient documentation

## 2022-04-20 DIAGNOSIS — S6992XD Unspecified injury of left wrist, hand and finger(s), subsequent encounter: Secondary | ICD-10-CM

## 2022-04-20 DIAGNOSIS — S6992XA Unspecified injury of left wrist, hand and finger(s), initial encounter: Secondary | ICD-10-CM | POA: Insufficient documentation

## 2022-04-20 DIAGNOSIS — S62635D Displaced fracture of distal phalanx of left ring finger, subsequent encounter for fracture with routine healing: Secondary | ICD-10-CM | POA: Insufficient documentation

## 2022-04-20 NOTE — Progress Notes (Signed)
[place] [facility] [place]  ORTHOPAEDIC SURGERY   HAND URGENT CARE      NAME: Carrie Munoz                                      MRN: [id number]  DATE: 04/20/22        REASON FOR VISIT:   Chief Complaint   Patient presents with   . Left Hand - Follow-up         ASSESSMENT     Carrie Munoz is a 56 y.o.female left hand dominant presenting with a left ring bony mallet injury.      PLAN     I had a long discussion with the patient regarding the pathophysiology of this problem and we agreed on the following plan:    We reviewed xrays together, showing a distal phalanx fracture. We discussed treatment for a mallet finger which includes immobilization. She is being referred to hand therapy for a customized finger splint. We discussed the importance of keeping the finger in extension while in the splint. Patient verbalized understanding. All questions and concerns were addressed. She does not have any pain at this time and will continue to follow restrictions. She will follow up in 4 weeks for evaluation.      Follow up: 4 weeks   Work:  Librarian, academic      The patient is in full understanding with the above and agrees with the plan. All questions were answered.    SUBJECTIVE     History of present Illness:  Carrie Munoz is a 56 y.o. female left hand dominant who presents for an Urgent Care follow up/ initial evaluation  of a left ring finger bony mallet deformity after she injured her finger while playing volleyball 2 weeks ago. she was hit by the ball and heard a pop and noticed a deformity. Went to urgent care and was placed into a splint. Patient says she was told by the nurse to move the finger, which she has been doing the last 2 weeks. At this time, she denies any numbness, burning, tingling sensations. She denies any pain or other injuries.       No past medical history on file.    Past Surgical History:   Procedure Laterality Date   . CONVERTED PROCEDURE      Open Treat Weight Bearing Distal Tibial & Fibular  Fracture Conversion Data        Allergy History as of 04/20/22     NO KNOWN DRUG ALLERGY       Noted Status Severity Type Reaction    04/06/11 0933 Edi Conversion, Allergies 04/15/09 Active       Comments: Created by Conversion - 0;                    OBJECTIVE     Vitals: BP 115/65   Pulse 76   Ht 1.575 m (5\' 2" )   Wt 68 kg (150 lb)   BMI 27.44 kg/m   General: The patient is alert and oriented x3, is well dressed and well kept, pleasant and cooperative through the evaluation.  Constitutional: Stable vital signs, hemodynamically stable, in no acute distress.    Upper Extremities:  Comprehensive exam of the LEFT upper extremity: Skin is intact. No erythema, ecchymosis, edema. Mallet deformity appreciated on the ring finger. Unable to extend the finger off the table.  Sensation is intact to light touch. No tenderness to palpation.  Moving all other fingers.   Fingers warm and perfused. Radial pulse palpable.         Imaging/ Studies:  XRAY of the left ring finger demonstrates an avulsion fracture of the base of the distal phalanx on the dorsal aspect of the finger.         Geralynn Rile, PA as of 1:44 PM 04/20/2022

## 2022-04-20 NOTE — Progress Notes (Signed)
Upper Extremity and Hand Rehabilitation  Hand Splint Evaluation    Carrie Munoz is a 56 y.o. female who is here to day for   Encounter Diagnosis   Name Primary?   . Closed fracture of distal phalanx of left ring finger with routine healing Yes       Affected Arm:  Left, Finger    Treatment:  Splint fabrication and instruction in splint use.    Splint type:  Custom Single Finger Splint    Splint wearing schedule:  All times except hygiene/skin    Patient /Family Education:  Splint purpose    Communication:  Instructed patient    Short Term Goals:  Splinting  After instruction, the patient will demonstrate independent (proper donning/doffing of splint, splint care, precautions, understanding of wearing schedule) to insure correct follow through with home care program.     Plan:  Follow-up with provider, Splint check only as needed    Larry Sierras, PT,DPT

## 2022-05-19 ENCOUNTER — Other Ambulatory Visit: Payer: Self-pay

## 2022-05-19 ENCOUNTER — Ambulatory Visit: Payer: BLUE CROSS/BLUE SHIELD | Admitting: Physician Assistant

## 2022-05-19 VITALS — BP 126/71 | HR 75

## 2022-05-19 DIAGNOSIS — S62635A Displaced fracture of distal phalanx of left ring finger, initial encounter for closed fracture: Secondary | ICD-10-CM

## 2022-05-19 NOTE — Progress Notes (Signed)
[place] [facility] [place]  ORTHOPAEDIC SURGERY   HAND URGENT CARE      NAME: Carrie Munoz                                      MRN: [id number]  DATE: 05/19/22        REASON FOR VISIT:   Chief Complaint   Patient presents with   . Left Ring Finger - Follow-up         ASSESSMENT     Carrie Munoz is a 56 y.o.female left-hand-dominant presenting for follow-up of a left ring finger bony mallet finger.  Patient has been wearing her splint at all times.  At this time she denies any pain, numbness, burning, tingling sensations       PLAN     I had a long discussion with the patient regarding the pathophysiology of this problem and we agreed on the following plan:    Patient still unable to extend the DIP joint of the ring finger when placed flat on the table.  She will continue wearing her splint at all times to prevent flexion.  She will continue with the restrictions as discussed during previous visits.  All her questions and concerns were addressed at this time.  We did discuss going to hand therapy after next visit to work on motion.  Patient states she has a very demanding job that makes it difficult coming to hand therapy weekly.  She would like to be discussed at her next visit.  We will follow back in 3 weeks to monitor her progress.     Follow up: 3 weeks  Work: Librarian, academic    The patient is in full understanding with the above and agrees with the plan. All questions were answered.    SUBJECTIVE     History of present Illness:  Carrie Munoz is a 56 y.o. female who presents for a follow-up evaluation  of of a left ring finger bony mallet finger she sustained approximately 6 weeks now, however 4 weeks in splint.  Patient has been wearing her splint at all times and following restrictions as directed.  Denies any pain at this time.       No past medical history on file.    Past Surgical History:   Procedure Laterality Date   . CONVERTED PROCEDURE      Open Treat Weight Bearing Distal Tibial & Fibular  Fracture Conversion Data        Allergy History as of 05/19/22     NO KNOWN DRUG ALLERGY       Noted Status Severity Type Reaction    04/06/11 0933 Edi Conversion, Allergies 04/15/09 Active       Comments: Created by Conversion - 0;                    OBJECTIVE     Vitals: BP 126/71   Pulse 75   General: The patient is alert and oriented x3, is well dressed and well kept, pleasant and cooperative through the evaluation.  Constitutional: Stable vital signs,  in no acute distress.    Upper Extremities:  Comprehensive exam of the left upper extremity:   Ring finger was placed flat on the table and splint was removed to assess skin.  No erythema, ecchymosis, edema.  Sensation is intact to light touch.  No tenderness to palpation.  She is  unable to extend at the DIP joint.  Splint was placed back on.  She is moving all of her fingers.  Fingers are warm and perfused, radial pulses palpable.        Imaging/ Studies:  X-rays of the left ring finger from previous visit was reviewed, there is an avulsion fracture on the dorsal aspect of the distal phalanx.  No other acute fractures or dislocations        Geralynn Rile, PA as of 4:44 PM 05/19/2022

## 2022-06-09 ENCOUNTER — Other Ambulatory Visit: Payer: Self-pay

## 2022-06-14 ENCOUNTER — Ambulatory Visit: Payer: BLUE CROSS/BLUE SHIELD | Admitting: Physician Assistant

## 2022-06-21 LAB — GYN CYTOLOGY

## 2022-06-22 ENCOUNTER — Other Ambulatory Visit: Payer: Self-pay | Admitting: Obstetrics and Gynecology

## 2022-06-23 NOTE — Telephone Encounter (Signed)
Last annual exam was 06/2021 Scheduled on 07/14/22 Last mammogram 06/2021

## 2022-07-07 NOTE — Progress Notes (Signed)
56 y.o. G46P2002 Married Caucasian female here for annual exam.    Some urgency to void.  No incontinence.  2 cups of coffee in the am.  Drinks a lot of water.  Rare leak with cough, sneeze.  Not an issue yet per patient.   On ERT.  No hot flashes.  Her goal is to come off ERT.   She will see her PCP at the end of August.   Moved to a new home.  Downsizing.   PCP: C.Melinda Crutch, MD    Patient's last menstrual period was 10/14/2014 (approximate).           Sexually active: Yes.    The current method of family planning is status post hysterectomy.    Exercising: No.  The patient does not participate in regular exercise at present.  Has a squat machine. Smoker:  no  Health Maintenance: Pap:   02-20-14 Neg:Neg HR HPV; 12-23-10 Neg History of abnormal Pap:  no MMG:  07-10-21  Neg/BiRads1.   Colonoscopy:  08/14/18 internal hemorrhoids; f/u 10 years BMD:   n/a  Result  n/a TDaP:  07-08-21 Gardasil:   no HIV: donated blood Hep C: donated blood Screening Labs:  PCP   reports that she has never smoked. She has never used smokeless tobacco. She reports that she does not drink alcohol and does not use drugs.  Past Medical History:  Diagnosis Date   Anemia    Low vitamin D level 2018   SVD (spontaneous vaginal delivery)    x 2   Wears glasses     Past Surgical History:  Procedure Laterality Date   CYSTOSCOPY N/A 11/05/2014   Procedure: CYSTOSCOPY;  Surgeon: Jamey Reas de Berton Lan, MD;  Location: [facility];  Service: Gynecology;  Laterality: N/A;   EYE SURGERY  1999   Bilateral Lasik   GYNECOLOGIC CRYOSURGERY     d/t heavy vaginal discharge   MASS EXCISION Right 03/21/2014   Procedure: WIDE EXCISION MELANOMA RIGHT LEG;  Surgeon: Imogene Burn. Georgette Dover, MD;  Location: [facility];  Service: General;  Laterality: Right;   ROBOTIC ASSISTED TOTAL HYSTERECTOMY Bilateral 11/05/2014   Procedure: ROBOTIC ASSISTED TOTAL HYSTERECTOMY WITH BILATERAL SALPINGECTOMY ;   Surgeon: Jamey Reas de Berton Lan, MD;  Location: [facility];  Service: Gynecology;  Laterality: Bilateral;   SHOULDER SURGERY Left 10/2015   torn Lt.labrum and bone spurs   TONSILLECTOMY AND ADENOIDECTOMY     WISDOM TOOTH EXTRACTION      Current Outpatient Medications  Medication Sig Dispense Refill   estradiol (VIVELLE-DOT) 0.05 MG/24HR patch PLACE 1 PATCH (0.05 MG TOTAL) ONTO THE SKIN 2 (TWO) TIMES A WEEK. 8 patch 0   Multiple Vitamin (MULTIVITAMIN) tablet Take 1 tablet by mouth daily.     No current facility-administered medications for this visit.    Family History  Problem Relation Age of Onset   Hypertension Father    Heart attack Father 73       bypass surgery   Cancer Mother 92       Dec--bone CA   Cancer Daughter 24       pancreatic cancer   Colon cancer Neg Hx    Esophageal cancer Neg Hx    Stomach cancer Neg Hx    Rectal cancer Neg Hx     Review of Systems  Genitourinary:  Positive for urgency.  All other systems reviewed and are negative.   Exam:   BP 120/70  Pulse 81   Ht 5' 4.5" (1.638 m)   Wt 147 lb (66.7 kg)   LMP 10/14/2014 (Approximate)   SpO2 98%   BMI 24.84 kg/m     General appearance: alert, cooperative and appears stated age Head: normocephalic, without obvious abnormality, atraumatic Neck: no adenopathy, supple, symmetrical, trachea midline and thyroid normal to inspection and palpation Lungs: clear to auscultation bilaterally Breasts: normal appearance, no masses or tenderness, No nipple retraction or dimpling, No nipple discharge or bleeding, No axillary adenopathy Heart: regular rate and rhythm Abdomen: soft, non-tender; no masses, no organomegaly Extremities: extremities normal, atraumatic, no cyanosis or edema Skin: skin color, texture, turgor normal. No rashes or lesions Lymph nodes: cervical, supraclavicular, and axillary nodes normal. Neurologic: grossly normal  Pelvic: External genitalia:  no lesions              No  abnormal inguinal nodes palpated.              Urethra:  normal appearing urethra with no masses, tenderness or lesions              Bartholins and Skenes: normal                 Vagina: normal appearing vagina with normal color and discharge, no lesions              Cervix: absent              Pap taken: no Bimanual Exam:  Uterus:  absent              Adnexa: no mass, fullness, tenderness              Rectal exam: yes.  Confirms.              Anus:  normal sphincter tone, no lesions  Chaperone was present for exam:  Estill Bamberg, CMA  Assessment:   Well woman visit with gynecologic exam. Status post robotic TLH and bilateral salpingectomy. ERT patient.  Elevated LDL cholesterol.  FH CAD.  Hx melanoma.  Urinary urgency.   Plan: Mammogram screening discussed. Self breast awareness reviewed. Pap and HR HPV as above. Guidelines for Calcium, Vitamin D, regular exercise program including cardiovascular and weight bearing exercise. Discused use of HRT which can increase risk of PE, DVT, MI, stroke and potentially breast cancer.  Will lower Vivelle Dot to 0.0375 twice weekly for one year.   She will let me know if her bladder symptoms worsen.  Labs with PCP.   Follow up annually and prn.   After visit summary provided.

## 2022-07-08 ENCOUNTER — Other Ambulatory Visit: Payer: Self-pay | Admitting: Gastroenterology

## 2022-07-14 ENCOUNTER — Ambulatory Visit (INDEPENDENT_AMBULATORY_CARE_PROVIDER_SITE_OTHER): Payer: BC Managed Care – PPO | Admitting: Obstetrics and Gynecology

## 2022-07-14 ENCOUNTER — Encounter: Payer: Self-pay | Admitting: Obstetrics and Gynecology

## 2022-07-14 VITALS — BP 120/70 | HR 81 | Ht 64.5 in | Wt 147.0 lb

## 2022-07-14 DIAGNOSIS — Z01419 Encounter for gynecological examination (general) (routine) without abnormal findings: Secondary | ICD-10-CM | POA: Diagnosis not present

## 2022-07-14 MED ORDER — ESTRADIOL 0.0375 MG/24HR TD PTTW: 1.0000 | MEDICATED_PATCH | TRANSDERMAL | 3 refills | Status: DC

## 2022-07-14 NOTE — Patient Instructions (Signed)
EXERCISE AND DIET:  We recommended that you start or continue a regular exercise program for good health. Regular exercise means any activity that makes your heart beat faster and makes you sweat.  We recommend exercising at least 30 minutes per day at least 3 days a week, preferably 4 or 5.  We also recommend a diet low in fat and sugar.  Inactivity, poor dietary choices and obesity can cause diabetes, heart attack, stroke, and kidney damage, among others.    ALCOHOL AND SMOKING:  Women should limit their alcohol intake to no more than 7 drinks/beers/glasses of wine (combined, not each!) per week. Moderation of alcohol intake to this level decreases your risk of breast cancer and liver damage. And of course, no recreational drugs are part of a healthy lifestyle.  And absolutely no smoking or even second hand smoke. Most people know smoking can cause heart and lung diseases, but did you know it also contributes to weakening of your bones? Aging of your skin?  Yellowing of your teeth and nails?  CALCIUM AND VITAMIN D:  Adequate intake of calcium and Vitamin D are recommended.  The recommendations for exact amounts of these supplements seem to change often, but generally speaking 600 mg of calcium (either carbonate or citrate) and 800 units of Vitamin D per day seems prudent. Certain women may benefit from higher intake of Vitamin D.  If you are among these women, your doctor will have told you during your visit.    PAP SMEARS:  Pap smears, to check for cervical cancer or precancers,  have traditionally been done yearly, although recent scientific advances have shown that most women can have pap smears less often.  However, every woman still should have a physical exam from her gynecologist every year. It will include a breast check, inspection of the vulva and vagina to check for abnormal growths or skin changes, a visual exam of the cervix, and then an exam to evaluate the size and shape of the uterus and  ovaries.  And after 58 years of age, a rectal exam is indicated to check for rectal cancers. We will also provide age appropriate advice regarding health maintenance, like when you should have certain vaccines, screening for sexually transmitted diseases, bone density testing, colonoscopy, mammograms, etc.   MAMMOGRAMS:  All women over 40 years old should have a yearly mammogram. Many facilities now offer a "3D" mammogram, which may cost around $50 extra out of pocket. If possible,  we recommend you accept the option to have the 3D mammogram performed.  It both reduces the number of women who will be called back for extra views which then turn out to be normal, and it is better than the routine mammogram at detecting truly abnormal areas.    COLONOSCOPY:  Colonoscopy to screen for colon cancer is recommended for all women at age 50.  We know, you hate the idea of the prep.  We agree, BUT, having colon cancer and not knowing it is worse!!  Colon cancer so often starts as a polyp that can be seen and removed at colonscopy, which can quite literally save your life!  And if your first colonoscopy is normal and you have no family history of colon cancer, most women don't have to have it again for 10 years.  Once every ten years, you can do something that may end up saving your life, right?  We will be happy to help you get it scheduled when you are ready.    Be sure to check your insurance coverage so you understand how much it will cost.  It may be covered as a preventative service at no cost, but you should check your particular policy.    Calcium Content in Foods Calcium is the most abundant mineral in the body. Most of the body's calcium supply is stored in bones and teeth. Calcium helps many parts of the body function normally, including: Blood and blood vessels. Nerves. Hormones. Muscles. Bones and teeth. When your calcium stores are low, you may be at risk for low bone mass, bone loss, and broken bones  (fractures). When you get enough calcium, it helps to support strong bones and teeth throughout your life. Calcium is especially important for: Children during growth spurts. Girls during adolescence. Women who are pregnant or breastfeeding. Women after their menstrual cycle stops (postmenopause). Women whose menstrual cycle has stopped due to anorexia nervosa or regular intense exercise. People who cannot eat or digest dairy products. Vegans. Recommended daily amounts of calcium: Women (ages 19 to 50): 1,000 mg per day. Women (ages 51 and older): 1,200 mg per day. Men (ages 19 to 70): 1,000 mg per day. Men (ages 71 and older): 1,200 mg per day. Women (ages 9 to 18): 1,300 mg per day. Men (ages 9 to 18): 1,300 mg per day. General information Eat foods that are high in calcium. Try to get most of your calcium from food. Some people may benefit from taking calcium supplements. Check with your health care provider or diet and nutrition specialist (dietitian) before starting any calcium supplements. Calcium supplements may interact with certain medicines. Too much calcium may cause other health problems, such as constipation and kidney stones. For the body to absorb calcium, it needs vitamin D. Sources of vitamin D include: Skin exposure to direct sunlight. Foods, such as egg yolks, liver, mushrooms, saltwater fish, and fortified milk. Vitamin D supplements. Check with your health care provider or dietitian before starting any vitamin D supplements. What foods are high in calcium?  Foods that are high in calcium contain more than 100 milligrams per serving. Fruits Fortified orange juice or other fruit juice, 300 mg per 8 oz serving. Vegetables Collard greens, 360 mg per 8 oz serving. Kale, 100 mg per 8 oz serving. Bok choy, 160 mg per 8 oz serving. Grains Fortified ready-to-eat cereals, 100 to 1,000 mg per 8 oz serving. Fortified frozen waffles, 200 mg in 2 waffles. Oatmeal, 140 mg in  1 cup. Meats and other proteins Sardines, canned with bones, 325 mg per 3 oz serving. Salmon, canned with bones, 180 mg per 3 oz serving. Canned shrimp, 125 mg per 3 oz serving. Baked beans, 160 mg per 4 oz serving. Tofu, firm, made with calcium sulfate, 253 mg per 4 oz serving. Dairy Yogurt, plain, low-fat, 310 mg per 6 oz serving. Nonfat milk, 300 mg per 8 oz serving. American cheese, 195 mg per 1 oz serving. Cheddar cheese, 205 mg per 1 oz serving. Cottage cheese 2%, 105 mg per 4 oz serving. Fortified soy, rice, or almond milk, 300 mg per 8 oz serving. Mozzarella, part skim, 210 mg per 1 oz serving. The items listed above may not be a complete list of foods high in calcium. Actual amounts of calcium may be different depending on processing. Contact a dietitian for more information. What foods are lower in calcium? Foods that are lower in calcium contain 50 mg or less per serving. Fruits Apple, about 6 mg. Banana, about 12 mg.   Vegetables Lettuce, 19 mg per 2 oz serving. Tomato, about 11 mg. Grains Rice, 4 mg per 6 oz serving. Boiled potatoes, 14 mg per 8 oz serving. White bread, 6 mg per slice. Meats and other proteins Egg, 27 mg per 2 oz serving. Red meat, 7 mg per 4 oz serving. Chicken, 17 mg per 4 oz serving. Fish, cod, or trout, 20 mg per 4 oz serving. Dairy Cream cheese, regular, 14 mg per 1 Tbsp serving. Brie cheese, 50 mg per 1 oz serving. Parmesan cheese, 70 mg per 1 Tbsp serving. The items listed above may not be a complete list of foods lower in calcium. Actual amounts of calcium may be different depending on processing. Contact a dietitian for more information. Summary Calcium is an important mineral in the body because it affects many functions. Getting enough calcium helps support strong bones and teeth throughout your life. Try to get most of your calcium from food. Calcium supplements may interact with certain medicines. Check with your health care provider  or dietitian before starting any calcium supplements. This information is not intended to replace advice given to you by your health care provider. Make sure you discuss any questions you have with your health care provider. Document Revised: 04/02/2020 Document Reviewed: 04/02/2020 Elsevier Patient Education  2023 [employer] Inc.  

## 2022-07-22 ENCOUNTER — Other Ambulatory Visit: Payer: Self-pay | Admitting: Obstetrics and Gynecology

## 2022-07-22 DIAGNOSIS — Z1231 Encounter for screening mammogram for malignant neoplasm of breast: Secondary | ICD-10-CM

## 2022-07-30 DIAGNOSIS — H00016 Hordeolum externum left eye, unspecified eyelid: Secondary | ICD-10-CM | POA: Diagnosis not present

## 2022-08-03 ENCOUNTER — Ambulatory Visit
Admission: RE | Admit: 2022-08-03 | Discharge: 2022-08-03 | Disposition: A | Discharge status: Home / Self Care | Payer: Self-pay | Source: Ambulatory Visit | Attending: Obstetrics and Gynecology | Admitting: Obstetrics and Gynecology

## 2022-08-03 DIAGNOSIS — Z1231 Encounter for screening mammogram for malignant neoplasm of breast: Secondary | ICD-10-CM

## 2022-08-06 DIAGNOSIS — H00016 Hordeolum externum left eye, unspecified eyelid: Secondary | ICD-10-CM | POA: Diagnosis not present

## 2022-08-19 DIAGNOSIS — Z0000 Encounter for general adult medical examination without abnormal findings: Secondary | ICD-10-CM | POA: Diagnosis not present

## 2022-08-19 DIAGNOSIS — Z13220 Encounter for screening for lipoid disorders: Secondary | ICD-10-CM | POA: Diagnosis not present

## 2022-08-30 DIAGNOSIS — D2371 Other benign neoplasm of skin of right lower limb, including hip: Secondary | ICD-10-CM | POA: Diagnosis not present

## 2022-08-30 DIAGNOSIS — L538 Other specified erythematous conditions: Secondary | ICD-10-CM | POA: Diagnosis not present

## 2022-08-30 DIAGNOSIS — D485 Neoplasm of uncertain behavior of skin: Secondary | ICD-10-CM | POA: Diagnosis not present

## 2022-08-30 DIAGNOSIS — H0019 Chalazion unspecified eye, unspecified eyelid: Secondary | ICD-10-CM | POA: Diagnosis not present

## 2022-10-26 DIAGNOSIS — H43811 Vitreous degeneration, right eye: Secondary | ICD-10-CM | POA: Diagnosis not present

## 2022-11-17 DIAGNOSIS — L821 Other seborrheic keratosis: Secondary | ICD-10-CM | POA: Diagnosis not present

## 2022-11-17 DIAGNOSIS — D225 Melanocytic nevi of trunk: Secondary | ICD-10-CM | POA: Diagnosis not present

## 2022-11-17 DIAGNOSIS — Z08 Encounter for follow-up examination after completed treatment for malignant neoplasm: Secondary | ICD-10-CM | POA: Diagnosis not present

## 2022-11-17 DIAGNOSIS — L814 Other melanin hyperpigmentation: Secondary | ICD-10-CM | POA: Diagnosis not present

## 2022-12-02 DIAGNOSIS — H43811 Vitreous degeneration, right eye: Secondary | ICD-10-CM | POA: Diagnosis not present

## 2022-12-30 DIAGNOSIS — H43811 Vitreous degeneration, right eye: Secondary | ICD-10-CM | POA: Diagnosis not present

## 2023-06-20 DIAGNOSIS — H43811 Vitreous degeneration, right eye: Secondary | ICD-10-CM | POA: Diagnosis not present

## 2023-06-21 ENCOUNTER — Other Ambulatory Visit: Payer: Self-pay | Admitting: Obstetrics and Gynecology

## 2023-06-21 NOTE — Telephone Encounter (Signed)
Medication refill request: vivelle dot patch 0.0375 mg Last AEX:  07-14-22 Next AEX: 08-01-23 with BS Last MMG (if hormonal medication request): 08-03-22 birads 1:neg Refill authorized: please approve if appropriate

## 2023-07-01 ENCOUNTER — Other Ambulatory Visit: Payer: Self-pay | Admitting: Obstetrics and Gynecology

## 2023-07-01 DIAGNOSIS — Z1231 Encounter for screening mammogram for malignant neoplasm of breast: Secondary | ICD-10-CM

## 2023-07-18 NOTE — Progress Notes (Signed)
57 y.o. G21P2002 Married Caucasian female here for annual exam.    Would like to wean off her estradiol patch.  No current hot flashes.  PCP: Gildardo Cranker, MD    Patient's last menstrual period was 10/14/2014 (approximate).           Sexually active: Yes.    The current method of family planning is status post hysterectomy.    Exercising: Yes.     Walk x3-4 days a week given the weather Smoker: no  Health Maintenance: Pap:  02/20/14 neg: HR HPV neg History of abnormal Pap:  no MMG:  08/03/22 Breast Density Cat B, BI-RADS CAT 1 neg; scheduled for 08/29 Colonoscopy: 08/14/18; Due in 2029 BMD: n/a  Result n/a TDaP: 07/08/21 Gardasil: no HIV: donated blood Hep C: donated blood Screening Labs:  PCP   reports that she has never smoked. She has never used smokeless tobacco. She reports that she does not drink alcohol and does not use drugs.  Past Medical History:  Diagnosis Date   Anemia    Low vitamin D level 2018   SVD (spontaneous vaginal delivery)    x 2   Wears glasses     Past Surgical History:  Procedure Laterality Date   CYSTOSCOPY N/A 11/05/2014   Procedure: CYSTOSCOPY;  Surgeon: Jacqualin Combes de Gwenevere Ghazi, MD;  Location: [facility];  Service: Gynecology;  Laterality: N/A;   EYE SURGERY  1999   Bilateral Lasik   GYNECOLOGIC CRYOSURGERY     d/t heavy vaginal discharge   MASS EXCISION Right 03/21/2014   Procedure: WIDE EXCISION MELANOMA RIGHT LEG;  Surgeon: Wilmon Arms. Corliss Skains, MD;  Location: [facility];  Service: General;  Laterality: Right;   ROBOTIC ASSISTED TOTAL HYSTERECTOMY Bilateral 11/05/2014   Procedure: ROBOTIC ASSISTED TOTAL HYSTERECTOMY WITH BILATERAL SALPINGECTOMY ;  Surgeon: Jacqualin Combes de Gwenevere Ghazi, MD;  Location: [facility];  Service: Gynecology;  Laterality: Bilateral;   SHOULDER SURGERY Left 10/2015   torn Lt.labrum and bone spurs   TONSILLECTOMY AND ADENOIDECTOMY     WISDOM TOOTH EXTRACTION      Current Outpatient  Medications  Medication Sig Dispense Refill   estradiol (VIVELLE-DOT) 0.0375 MG/24HR PLACE 1 PATCH ONTO THE SKIN 2 TIMES A WEEK. 24 patch 0   Multiple Vitamin (MULTIVITAMIN) tablet Take 1 tablet by mouth daily.     No current facility-administered medications for this visit.    Family History  Problem Relation Age of Onset   Hypertension Father    Heart attack Father 72       bypass surgery   Cancer Mother 55       Dec--bone CA   Cancer Daughter 24       pancreatic cancer   Colon cancer Neg Hx    Esophageal cancer Neg Hx    Stomach cancer Neg Hx    Rectal cancer Neg Hx     Review of Systems  All other systems reviewed and are negative.   Exam:   BP 122/82   Pulse 80   Ht 5' 5.25" (1.657 m)   Wt 149 lb (67.6 kg)   LMP 10/14/2014 (Approximate)   SpO2 98%   BMI 24.61 kg/m     General appearance: alert, cooperative and appears stated age Head: normocephalic, without obvious abnormality, atraumatic Neck: no adenopathy, supple, symmetrical, trachea midline and thyroid normal to inspection and palpation Lungs: clear to auscultation bilaterally Breasts: normal appearance, no masses or tenderness, No nipple retraction or  dimpling, No nipple discharge or bleeding, No axillary adenopathy Heart: regular rate and rhythm Abdomen: soft, non-tender; no masses, no organomegaly Extremities: extremities normal, atraumatic, no cyanosis or edema Skin: skin color, texture, turgor normal. No rashes or lesions Lymph nodes: cervical, supraclavicular, and axillary nodes normal. Neurologic: grossly normal  Pelvic: External genitalia:  no lesions              No abnormal inguinal nodes palpated.              Urethra:  normal appearing urethra with no masses, tenderness or lesions              Bartholins and Skenes: normal                 Vagina: normal appearing vagina with normal color and discharge, no lesions              Cervix: absent              Pap taken: no Bimanual Exam:  Uterus:   absent              Adnexa: no mass, fullness, tenderness              Rectal exam: yes.  Confirms.              Anus:  normal sphincter tone, no lesions  Chaperone was present for exam:  Senaida Ores, CMA  Assessment:   Well woman visit with gynecologic exam. Status post robotic TLH and bilateral salpingectomy. ERT patient.  Elevated LDL cholesterol.  FH CAD.  Hx melanoma.   Plan: Mammogram screening discussed. Self breast awareness reviewed. Pap and HR HPV not indicated. Guidelines for Calcium, Vitamin D, regular exercise program including cardiovascular and weight bearing exercise. Will lower Vivelle Dot to 0.025 mg twice weekly.  #24, RF one.   We dicussed risks of stroke, DVT, PE and benefits of treating hot flashes and reducing risk of osteoporosis.  Patient's current goal is to wean off this winter.   Follow up annually and prn.   After visit summary provided.

## 2023-07-20 ENCOUNTER — Ambulatory Visit: Payer: BC Managed Care – PPO | Admitting: Obstetrics and Gynecology

## 2023-08-01 ENCOUNTER — Encounter: Payer: Self-pay | Admitting: Obstetrics and Gynecology

## 2023-08-01 ENCOUNTER — Ambulatory Visit (INDEPENDENT_AMBULATORY_CARE_PROVIDER_SITE_OTHER): Payer: BC Managed Care – PPO | Admitting: Obstetrics and Gynecology

## 2023-08-01 VITALS — BP 122/82 | HR 80 | Ht 65.25 in | Wt 149.0 lb

## 2023-08-01 DIAGNOSIS — Z01419 Encounter for gynecological examination (general) (routine) without abnormal findings: Secondary | ICD-10-CM | POA: Diagnosis not present

## 2023-08-01 MED ORDER — ESTRADIOL 0.025 MG/24HR TD PTTW: 1.0000 | MEDICATED_PATCH | TRANSDERMAL | 1 refills | Status: DC

## 2023-08-01 NOTE — Patient Instructions (Signed)

## 2023-08-18 ENCOUNTER — Ambulatory Visit
Admission: RE | Admit: 2023-08-18 | Discharge: 2023-08-18 | Disposition: A | Discharge status: Home / Self Care | Payer: BC Managed Care – PPO | Source: Ambulatory Visit | Attending: Obstetrics and Gynecology | Admitting: Obstetrics and Gynecology

## 2023-08-18 DIAGNOSIS — Z1231 Encounter for screening mammogram for malignant neoplasm of breast: Secondary | ICD-10-CM | POA: Diagnosis not present

## 2023-08-31 ENCOUNTER — Ambulatory Visit: Payer: BLUE CROSS/BLUE SHIELD | Admitting: Internal Medicine

## 2023-08-31 DIAGNOSIS — Z0000 Encounter for general adult medical examination without abnormal findings: Secondary | ICD-10-CM | POA: Diagnosis not present

## 2023-08-31 DIAGNOSIS — Z13220 Encounter for screening for lipoid disorders: Secondary | ICD-10-CM | POA: Diagnosis not present

## 2023-09-05 ENCOUNTER — Other Ambulatory Visit: Payer: Self-pay | Admitting: Gastroenterology

## 2023-09-05 DIAGNOSIS — H43391 Other vitreous opacities, right eye: Secondary | ICD-10-CM | POA: Diagnosis not present

## 2023-09-05 DIAGNOSIS — H2513 Age-related nuclear cataract, bilateral: Secondary | ICD-10-CM | POA: Diagnosis not present

## 2023-09-05 DIAGNOSIS — H43813 Vitreous degeneration, bilateral: Secondary | ICD-10-CM | POA: Diagnosis not present

## 2023-09-05 LAB — HM MAMMOGRAPHY

## 2023-09-07 DIAGNOSIS — Z0000 Encounter for general adult medical examination without abnormal findings: Secondary | ICD-10-CM | POA: Diagnosis not present

## 2023-09-11 ENCOUNTER — Other Ambulatory Visit: Payer: Self-pay | Admitting: Obstetrics & Gynecology

## 2023-09-11 DIAGNOSIS — Z01419 Encounter for gynecological examination (general) (routine) without abnormal findings: Secondary | ICD-10-CM

## 2023-09-12 DIAGNOSIS — H43811 Vitreous degeneration, right eye: Secondary | ICD-10-CM | POA: Diagnosis not present

## 2023-09-13 NOTE — Telephone Encounter (Signed)
Med refill request: estradiol patch 0.025mg  Last ov:  08/01/23 to discontinue patch Next AEX: n/a Last MMG (if hormonal med) 08/18/23 Refill denied and sent to provider.

## 2023-09-19 ENCOUNTER — Encounter: Payer: Self-pay | Admitting: Internal Medicine

## 2023-09-21 ENCOUNTER — Ambulatory Visit: Payer: BLUE CROSS/BLUE SHIELD | Admitting: Internal Medicine

## 2023-09-21 ENCOUNTER — Encounter: Payer: Self-pay | Admitting: Gastroenterology

## 2023-09-21 ENCOUNTER — Encounter: Payer: Self-pay | Admitting: Internal Medicine

## 2023-09-21 ENCOUNTER — Other Ambulatory Visit: Payer: Self-pay

## 2023-09-21 VITALS — BP 140/100 | HR 64 | Temp 97.4°F | Resp 16 | Ht 61.02 in | Wt 152.8 lb

## 2023-09-21 DIAGNOSIS — Z0000 Encounter for general adult medical examination without abnormal findings: Secondary | ICD-10-CM | POA: Insufficient documentation

## 2023-09-21 DIAGNOSIS — Z23 Encounter for immunization: Secondary | ICD-10-CM

## 2023-09-21 NOTE — Patient Instructions (Signed)
Debrox 2-3 drops in both ears nightly for 2 weeks.   Normal saline nasal spray to decrease post nasal drip.

## 2023-09-21 NOTE — Progress Notes (Signed)
CC:   Chief Complaint   Patient presents with    Establish Care       HPI: Carrie Munoz is a 57 y.o. female who presented today for establishing care. She is also due for her physical.    Review of Systems:  General: No fever, chills, lightheadedness, dizziness, weight loss, night sweats  HEENT:  ,No  runny nose, epistaxis, sinus pain, ear pain, tinnitus,  sore throat,   Pulmonary: No cough, sputum, wheezing, shortness of breath, dyspnea on exertion, hemoptysis   Cardiovascular: No chest pain, PND, orthopnea, palpitations,   Gastrointestinal: No nausea, vomiting, abdominal pain,   cramping, diarrhea, constipation,    Genitourinary: No urgency, frequency, hesitancy, dysuria, polyuria, incontinence, hematuria  Musculoskeletal: No pain, stiffness, joint swelling, or decreased range of motion  Lymphatic/Hematologic: No adenopathy , no excessive bleeding/bruising  Integumentary: No rash, itching, lesions  Neurologic: No numbness, weakness, balance changes,  seizures, headaches, or paresthesias  Psychiatric: No report of depression/anxiety, no suicidal/homicidal ideation    Medications reviewed in eRecord today and confirmed with the patient without changes being made.    Medications:    Current Outpatient Medications:     Multiple Vitamin (MULTIVITAMIN ADULT PO), Take by mouth., Disp: , Rfl:     SUMAtriptan (IMITREX) 25 MG tablet, Take 1 tablet (25 mg total) by mouth as needed for Migraine   Take at onset of headache. May repeat once in 2 hours. (Patient not taking: Reported on 09/21/2023), Disp: 3 tablet, Rfl: 1    Allergies:  Allergy History as of 09/21/23       NO KNOWN DRUG ALLERGY         Noted Status Severity Type Reaction    04/06/11 0933 Edi Conversion, Allergies 04/15/09 Active       Comments: Created by Conversion - 0;                      Family History:  Family History   Problem Relation Age of Onset    High Blood Pressure Mother     Diabetes Mother     High Blood Pressure Father     Thyroid disease Father          Allergenthyroidhypo    Prostate cancer Father     Diabetes Maternal Aunt        Social History:  Social History     Socioeconomic History    Marital status: Married   Tobacco Use    Smoking status: Former     Packs/day: 1.00     Years: 4.00     Additional pack years: 0.00     Total pack years: 4.00     Types: Cigarettes     Quit date: 12/20/1981     Years since quitting: 41.7    Smokeless tobacco: Never   Substance and Sexual Activity    Alcohol use: No    Drug use: No         Past Surgical History:   Procedure Laterality Date    CONVERTED PROCEDURE      Open Treat Weight Bearing Distal Tibial & Fibular Fracture Conversion Data        No past medical history on file.    Vital Signs:  Vitals:    09/21/23 1339   BP: (!) 140/100   Pulse: 64   Resp: 16   Temp: 36.3 C (97.4 F)   Weight: 69.3 kg (152 lb 12.8 oz)   Height: 1.55 m (5'  1.02")     Last 4 Weights    09/21/23 1339   Weight: 69.3 kg (152 lb 12.8 oz)         Physical Exam:  General: Well developed/well nourished female in no acute distress   ENT: Cerumen impaction bilaterally   Neck: Supple, no submandibular, supraclavicular, suboccipital LAD. No JVD/Bruits appreciated.  Pulmonary: Lungs clear to auscultation bilaterally with normal air entry, no wheezes/rales/rhonchi. No labored respirations  Cardiovascular: S1/S2, regular rate/rhythm, no M/R/G. No peripheral edema  Gastrointestinal: Abdomen soft, non-distended and non-tender in all 4 quadrants,  no masses or organomegaly palpated. BS present  Musculoskeletal: Range of motion intact with strength 5/5 in all extremities, spine is straight.  Gait is normal  Lymphatic: No anterior cervical or mid clavicular adenopathy  Neurologic: Alert and oriented x3, cranial nerves II through XII are intact, Speech is coherent, reflexes are symmetrical good finger-nose, heel shin and graphesthesia  Psychiatric: Mood and affect are good.  Appropriate groomed with good eye contact    Assessment and Plan  1. Annual physical exam  (Primary)  Labs will be reviewed once available. Recommend healthy diet and regular exercise. Seat belt recommendations have been stressed along with sunscreen use. Pap exam will be scheduled through gyn. Referral to dermatology placed for annual skin exam. Family h/o skin cancer.   - Comprehensive metabolic panel; Future  - Lipid Panel (Reflex to Direct  LDL if Triglycerides more than 400); Future  - CBC and differential; Future  - Hemoglobin A1c; Future  - Vitamin D; Future  - TSH; Future    2. Need for tetanus booster  Tetanus booster administered today.   - Td (>/=59yo) (Tenivac)      Colonoscopy due in 2028.     Action Items  Vanetta Shawl, MD  09/21/2023

## 2023-10-11 ENCOUNTER — Ambulatory Visit: Payer: BLUE CROSS/BLUE SHIELD | Admitting: Internal Medicine

## 2023-11-02 DIAGNOSIS — Z03818 Encounter for observation for suspected exposure to other biological agents ruled out: Secondary | ICD-10-CM | POA: Diagnosis not present

## 2023-11-02 DIAGNOSIS — J029 Acute pharyngitis, unspecified: Secondary | ICD-10-CM | POA: Diagnosis not present

## 2023-11-25 ENCOUNTER — Other Ambulatory Visit: Payer: Self-pay | Admitting: Obstetrics & Gynecology

## 2023-11-25 DIAGNOSIS — Z780 Asymptomatic menopausal state: Secondary | ICD-10-CM

## 2023-11-25 DIAGNOSIS — Z01419 Encounter for gynecological examination (general) (routine) without abnormal findings: Secondary | ICD-10-CM

## 2023-11-25 NOTE — Telephone Encounter (Signed)
Med refill request: estradiol 0.0375 mg patch Last OV:  08/01/23 Dr. Edward Jolly Next AEX: Last MMG (if hormonal med) RX changed to estradiol 0.025 mg patch 08/01/23 and patient was to wean off. Refill sent to provider for denial.

## 2023-12-28 DIAGNOSIS — H2513 Age-related nuclear cataract, bilateral: Secondary | ICD-10-CM | POA: Diagnosis not present

## 2023-12-28 DIAGNOSIS — H43813 Vitreous degeneration, bilateral: Secondary | ICD-10-CM | POA: Diagnosis not present

## 2023-12-28 DIAGNOSIS — H43391 Other vitreous opacities, right eye: Secondary | ICD-10-CM | POA: Diagnosis not present

## 2024-01-03 DIAGNOSIS — M7541 Impingement syndrome of right shoulder: Secondary | ICD-10-CM | POA: Diagnosis not present

## 2024-01-04 DIAGNOSIS — H43811 Vitreous degeneration, right eye: Secondary | ICD-10-CM | POA: Diagnosis not present

## 2024-01-05 ENCOUNTER — Other Ambulatory Visit
Admission: RE | Admit: 2024-01-05 | Discharge: 2024-01-05 | Disposition: A | Discharge status: Home / Self Care | Payer: BLUE CROSS/BLUE SHIELD | Source: Ambulatory Visit | Attending: Internal Medicine | Admitting: Internal Medicine

## 2024-01-05 DIAGNOSIS — Z0000 Encounter for general adult medical examination without abnormal findings: Secondary | ICD-10-CM | POA: Insufficient documentation

## 2024-01-05 LAB — VITAMIN D: 25-OH Vit Total: 27 ng/mL — ABNORMAL LOW (ref 30–60)

## 2024-01-05 LAB — COMPREHENSIVE METABOLIC PANEL
ALT: 29 U/L (ref 0–35)
AST: 29 U/L (ref 0–35)
Albumin: 4.6 g/dL (ref 3.5–5.2)
Alk Phos: 84 U/L (ref 35–105)
Anion Gap: 10 (ref 7–16)
Bilirubin,Total: 1.2 mg/dL (ref 0.0–1.2)
CO2: 25 mmol/L (ref 20–28)
Calcium: 9.6 mg/dL (ref 8.6–10.2)
Chloride: 103 mmol/L (ref 96–108)
Creatinine: 0.7 mg/dL (ref 0.51–0.95)
Glucose: 94 mg/dL (ref 60–99)
Lab: 19 mg/dL (ref 6–20)
Potassium: 4.8 mmol/L (ref 3.3–5.1)
Sodium: 138 mmol/L (ref 133–145)
Total Protein: 6.9 g/dL (ref 6.3–7.7)
eGFR BY CREAT: 99 *

## 2024-01-05 LAB — CBC AND DIFFERENTIAL
Baso # K/uL: 0.1 10*3/uL (ref 0.0–0.2)
Eos # K/uL: 0.3 10*3/uL (ref 0.0–0.5)
Hematocrit: 43 % (ref 34–49)
Hemoglobin: 14 g/dL (ref 11.2–16.0)
IMM Granulocytes #: 0.1 10*3/uL — ABNORMAL HIGH (ref 0.0–0.0)
IMM Granulocytes: 0.8 %
Lymph # K/uL: 2 10*3/uL (ref 1.0–5.0)
MCV: 92 fL (ref 75–100)
Mono # K/uL: 0.6 10*3/uL (ref 0.1–1.0)
Neut # K/uL: 3.4 10*3/uL (ref 1.5–6.5)
Nucl RBC # K/uL: 0 10*3/uL (ref 0.0–0.0)
Nucl RBC %: 0 /100{WBCs} (ref 0.0–0.2)
Platelets: 210 10*3/uL (ref 150–450)
RBC: 4.7 MIL/uL (ref 4.0–5.5)
RDW: 12.3 % (ref 0.0–15.0)
Seg Neut %: 52.7 %
WBC: 6.4 10*3/uL (ref 3.5–11.0)

## 2024-01-05 LAB — LIPID PANEL
Chol/HDL Ratio: 2.7
Cholesterol: 180 mg/dL
HDL: 67 mg/dL — ABNORMAL HIGH (ref 40–60)
LDL Calculated: 104 mg/dL
Non HDL Cholesterol: 113 mg/dL
Triglycerides: 43 mg/dL

## 2024-01-05 LAB — TSH: TSH: 1.35 u[IU]/mL (ref 0.27–4.20)

## 2024-01-05 LAB — HEMOGLOBIN A1C: Hemoglobin A1C: 5.7 % — ABNORMAL HIGH

## 2024-01-06 ENCOUNTER — Telehealth: Payer: Self-pay | Admitting: Internal Medicine

## 2024-01-06 NOTE — Telephone Encounter (Signed)
 Called and spoke with patient. Reviewed results below. Patient verbalizes understanding and is agreeable.

## 2024-01-06 NOTE — Telephone Encounter (Signed)
-----   Message from Vanetta Shawl, MD sent at 01/06/2024 12:54 PM EST -----  Results have been received for workup recently undertaken for:     Patient:  Carrie Munoz DOB:  [birth year]  The results are within normal limits, vitamin D levels are slightly low recommend increasing to 500IU daily.   Our office will communicate these results to patient via phone call  Vanetta Shawl, MD  January 06, 2024 12:50 PM

## 2024-02-17 ENCOUNTER — Other Ambulatory Visit: Payer: Self-pay | Admitting: Obstetrics and Gynecology

## 2024-02-17 DIAGNOSIS — Z780 Asymptomatic menopausal state: Secondary | ICD-10-CM

## 2024-02-17 NOTE — Telephone Encounter (Signed)
 Med refill request: estradiol 0.025 mg patch Last AEX: 08/01/23 Next AEX: 08/02/24 Last MMG (if hormonal med) 08/18/23 BI-RADS 1 negative Refill authorized: estradiol 0.025 mg patch Sent to provider for approval or denial as appropriate.

## 2024-02-20 ENCOUNTER — Encounter: Payer: Self-pay | Admitting: Obstetrics and Gynecology

## 2024-02-20 ENCOUNTER — Other Ambulatory Visit: Payer: Self-pay | Admitting: Obstetrics and Gynecology

## 2024-02-20 MED ORDER — ESTRADIOL 0.025 MG/24HR TD PTTW: 1.0000 | MEDICATED_PATCH | TRANSDERMAL | 1 refills | Status: DC

## 2024-02-20 NOTE — Telephone Encounter (Signed)
 Per mychart encounter dated 02/19/3034. Pharmacy changed to [facility]. Rx refill request amended.

## 2024-02-20 NOTE — Progress Notes (Signed)
 Vivelle Dot sent to new [facility], [facility] on [address]

## 2024-04-27 DIAGNOSIS — L811 Chloasma: Secondary | ICD-10-CM | POA: Diagnosis not present

## 2024-04-27 DIAGNOSIS — D225 Melanocytic nevi of trunk: Secondary | ICD-10-CM | POA: Diagnosis not present

## 2024-04-27 DIAGNOSIS — L814 Other melanin hyperpigmentation: Secondary | ICD-10-CM | POA: Diagnosis not present

## 2024-04-27 DIAGNOSIS — L821 Other seborrheic keratosis: Secondary | ICD-10-CM | POA: Diagnosis not present

## 2024-08-01 NOTE — Progress Notes (Signed)
 58 y.o. G52P2003 Married Caucasian female here for annual exam.    On ERT.  Her goal is to stay on her ERT for another year.   Notes some facial acne. Sees dermatology for skin checks.  Donates blood every other month.  Takes a Flintstone vitamin with iron to balance this.   Daughter just had a miscarriage.  PCP: Okey Carlin Redbird, MD   Patient's last menstrual period was 10/14/2014 (approximate).           Sexually active: Yes.    The current method of family planning is status post hysterectomy.    Menopausal hormone therapy:  Vivelle   Exercising: Yes.    Walking Smoker:  no  OB History  Gravida Para Term Preterm AB Living  2 2 2  0 0 3  SAB IAB Ectopic Multiple Live Births  0 0 0 0 2    # Outcome Date GA Lbr Len/2nd Weight Sex Type Anes PTL Lv  2 Term         LIV  1 Term         LIV    Obstetric Comments  1 adopted son in 3/94     HEALTH MAINTENANCE: Last 2 paps:  02/20/14 neg HR HPV neg  History of abnormal Pap or positive HPV:  no Mammogram:   08/18/23 Breast Density Cat B, BIRADS Cat 1 neg  Colonoscopy:  08/14/18 - due in 2029. Bone Density:  n/a  Result  n/a   Immunization History  Administered Date(s) Administered   Influenza-Unspecified 11/18/2021   PFIZER Comirnaty(Gray Top)Covid-19 Tri-Sucrose Vaccine 04/06/2021   PFIZER(Purple Top)SARS-COV-2 Vaccination 03/01/2020, 03/25/2020   Pfizer Covid-19 Vaccine Bivalent Booster 32yrs & up 10/01/2021   Tdap 08/21/2011, 09/03/2011, 07/08/2021      reports that she has never smoked. She has never used smokeless tobacco. She reports that she does not drink alcohol and does not use drugs.  Past Medical History:  Diagnosis Date   Anemia    Low vitamin D  level 2018   SVD (spontaneous vaginal delivery)    x 2   Wears glasses     Past Surgical History:  Procedure Laterality Date   CYSTOSCOPY N/A 11/05/2014   Procedure: CYSTOSCOPY;  Surgeon: Bobie FORBES Crown de Charlynn FORBES Cary, MD;  Location: [facility];   Service: Gynecology;  Laterality: N/A;   EYE SURGERY  1999   Bilateral Lasik   GYNECOLOGIC CRYOSURGERY     d/t heavy vaginal discharge   MASS EXCISION Right 03/21/2014   Procedure: WIDE EXCISION MELANOMA RIGHT LEG;  Surgeon: Donnice POUR. Belinda, MD;  Location: [facility];  Service: General;  Laterality: Right;   ROBOTIC ASSISTED TOTAL HYSTERECTOMY Bilateral 11/05/2014   Procedure: ROBOTIC ASSISTED TOTAL HYSTERECTOMY WITH BILATERAL SALPINGECTOMY ;  Surgeon: Bobie FORBES Crown de Charlynn FORBES Cary, MD;  Location: [facility];  Service: Gynecology;  Laterality: Bilateral;   SHOULDER SURGERY Left 10/2015   torn Lt.labrum and bone spurs   TONSILLECTOMY AND ADENOIDECTOMY     WISDOM TOOTH EXTRACTION      Current Outpatient Medications  Medication Sig Dispense Refill   estradiol  (VIVELLE -DOT) 0.025 MG/24HR Place 1 patch onto the skin 2 (two) times a week. 24 patch 1   Multiple Vitamin (MULTIVITAMIN) tablet Take 1 tablet by mouth daily.     No current facility-administered medications for this visit.    ALLERGIES: Prednisone  Family History  Problem Relation Age of Onset   Hypertension Father    Heart attack Father  59       bypass surgery   Cancer Mother 66       Dec--bone CA   Cancer Daughter 24       pancreatic cancer   Colon cancer Neg Hx    Esophageal cancer Neg Hx    Stomach cancer Neg Hx    Rectal cancer Neg Hx     Review of Systems  All other systems reviewed and are negative.   PHYSICAL EXAM:  BP 130/84 (BP Location: Left Arm, Patient Position: Sitting)   Pulse 83   Ht 5' 6.75 (1.695 m)   Wt 146 lb (66.2 kg)   LMP 10/14/2014 (Approximate)   SpO2 97%   BMI 23.04 kg/m     General appearance: alert, cooperative and appears stated age Head: normocephalic, without obvious abnormality, atraumatic Neck: no adenopathy, supple, symmetrical, trachea midline and thyroid normal to inspection and palpation Lungs: clear to auscultation bilaterally Breasts: normal  appearance, no masses or tenderness, No nipple retraction or dimpling, No nipple discharge or bleeding, No axillary adenopathy Heart: regular rate and rhythm Abdomen: soft, non-tender; no masses, no organomegaly Extremities: extremities normal, atraumatic, no cyanosis or edema Skin: skin color, texture, turgor normal. No rashes or lesions Lymph nodes: cervical, supraclavicular, and axillary nodes normal. Neurologic: grossly normal  Pelvic: External genitalia:  no lesions              No abnormal inguinal nodes palpated.              Urethra:  normal appearing urethra with no masses, tenderness or lesions              Bartholins and Skenes: normal                 Vagina: normal appearing vagina with normal color and discharge, no lesions              Cervix: absent              Pap taken: no Bimanual Exam:  Uterus:  absent              Adnexa: no mass, fullness, tenderness              Rectal exam: yes.  Confirms.              Anus:  normal sphincter tone, no lesions  Chaperone was present for exam:  Kari HERO, CMA  ASSESSMENT: Well woman visit with gynecologic exam. Status post robotic TLH and bilateral salpingectomy. ERT patient.  Elevated LDL cholesterol.  FH CAD.  Hx melanoma.  PHQ-2-9: 0  PLAN: Mammogram screening discussed. Self breast awareness reviewed. Pap and HRV collected:  no.  Not indicated.  Guidelines for Calcium, Vitamin D , regular exercise program including cardiovascular and weight bearing exercise. Risks and benefits of ERT discussed.  Potential risks include stroke, PE, and DVT.  Medication refills:  Vivelle  Dot 0.025 mg twice weekly.  #24, RF 3.  Labs with PCP.  Follow up:  yearly and prn.

## 2024-08-02 ENCOUNTER — Encounter: Payer: Self-pay | Admitting: Obstetrics and Gynecology

## 2024-08-02 ENCOUNTER — Ambulatory Visit (INDEPENDENT_AMBULATORY_CARE_PROVIDER_SITE_OTHER): Payer: BC Managed Care – PPO | Admitting: Obstetrics and Gynecology

## 2024-08-02 VITALS — BP 130/84 | HR 83 | Ht 66.75 in | Wt 146.0 lb

## 2024-08-02 DIAGNOSIS — Z01419 Encounter for gynecological examination (general) (routine) without abnormal findings: Secondary | ICD-10-CM | POA: Diagnosis not present

## 2024-08-02 DIAGNOSIS — Z1331 Encounter for screening for depression: Secondary | ICD-10-CM | POA: Diagnosis not present

## 2024-08-02 MED ORDER — ESTRADIOL 0.025 MG/24HR TD PTTW: 1.0000 | MEDICATED_PATCH | TRANSDERMAL | 3 refills | Status: AC

## 2024-08-02 NOTE — Patient Instructions (Addendum)
 Calcium in Foods Calcium is a mineral in the body. Of all the minerals in your body, you have the most calcium. Most of the body's calcium supply is stored in bones and teeth. Calcium helps many parts of the body work, including: Blood and blood vessels. Nerves. Hormones. Muscles. Bones and teeth. When your calcium stores are low, you may be at risk for low bone mass, bone loss, and broken bones. When you get enough calcium, it helps to support strong bones and teeth throughout your life. Calcium is especially important for: Children during growth spurts. Females during adolescence. Females who are pregnant or breastfeeding. Females after their menstrual cycle stops (postmenopausal). Females whose menstrual cycle has stopped because of an eating disorder or regular intense exercise. People who can't eat or digest dairy products. People who eat a vegan diet. Recommended daily amounts of calcium: Females (ages 87 to 55): 1,000 mg per day. Females (ages 35 and older): 1,200 mg per day. Males (ages 53 to 45): 1,000 mg per day. Males (ages 43 and older): 1,200 mg per day. Females (ages 88 to 28): 1,300 mg per day. Males (ages 41 to 56): 1,300 mg per day. General information Eat foods that are high in calcium. Try to get most of your calcium from food. Some people may benefit from taking calcium supplements. Check with your health care provider or an expert in healthy eating called a dietitian before starting any calcium supplements. Calcium supplements may interact with certain medicines. Too much calcium may cause other health problems, such as trouble pooping and kidney stones. For the body to absorb calcium, it needs vitamin D. Sources of vitamin D include: Skin exposure to direct sunlight. Foods, such as egg yolks, liver, mushrooms, saltwater fish, and fortified milk. Vitamin D supplements. Check with your provider or dietitian before starting any vitamin D supplements. The amount of  calcium that is absorbed in the body varies with type of food. Talk to a dietitian about what foods are best for you, especially if you are eat a vegan diet or don't eat dairy. What foods are high in calcium?  Foods that are high in calcium contain more than 100 milligrams per serving. Fruits Fortified orange juice or other fruit juice, 300 mg per 8 oz (237 mL) serving. Vegetables Collard greens, 260 mg per 1 cup (130 g) serving, cooked. Kale, 180 mg per 1 cup (118 g) serving, cooked. Bok choy, 180 mg per 1 cup (170 g) serving, cooked Grains Fortified frozen waffles, 200 mg in 2 waffles. Oatmeal, 180 mg in 1 cup (234 g) serving, cooked. Fortified white bread, 175 mg per slice. Meats and other proteins Sardines, canned with bones, 350 mg per 3.75 oz (92 g) serving. Salmon, canned with bones, 168 mg per 3 oz (85 g) serving. Canned shrimp, 125 mg per 3 oz (85 g) serving. Baked beans, 120 mg per 1 cup (266 g) serving. Tofu, firm, made with calcium sulfate, 861 mg per  cup (126 g) serving. Dairy Yogurt, plain, low-fat, 448 mg per 1 cup (245 g) serving Nonfat milk, 300 mg per 1 cup (245 g) serving. American cheese, 145 mg per 1 oz (21 g) serving or 1 slice. Cheddar cheese, 200 mg per 1 oz (28 g) serving or 1 slice. Cottage cheese 2%, 125 mg per  cup (113 g) serving. Fortified soy, rice, or almond milk, 300 mg per 1 cup (237 mL) serving. Mozzarella, part skim, 210 mg per 1 oz (21 g) serving. The items listed  above may not be a complete list of foods high in calcium. Actual amounts of calcium may be different depending on processing. Contact a dietitian for more information. What foods are lower in calcium? Foods that are lower in calcium contain 50 mg or less per serving. Fruits Apple, 1 medium, about 6 mg. Banana, 1 medium, about 12 mg. Vegetables Lettuce, 19 mg per 1 cup (35 g) serving. Tomato, 1 small, about 11 mg. Grains Rice, white, 8 mg per  cup (79 g) serving. Boiled  potatoes, 14 mg per 1 cup (160 g) serving. White bread, 6 mg per slice. Meats and other proteins Egg, 24 mg per 1 egg (50 g). Red meat, 7 mg per 4 oz (80 g) serving. Chicken, 17 mg per 4 oz (113 g) serving. Fish, cod, or trout, 20 mg per 4 oz (140 g) serving. Dairy Cream cheese, regular, 14 mg per 1 Tbsp (15 g) serving. Brie cheese, 50 mg per 1 oz (32 g) serving. The items listed above may not be a complete list of foods lower in calcium. Actual amounts of calcium may be different depending on processing. Contact a dietitian for more information. This information is not intended to replace advice given to you by your health care provider. Make sure you discuss any questions you have with your health care provider. Document Revised: 09/03/2023 Document Reviewed: 09/03/2023 Elsevier Patient Education  2024 [employer].  EXERCISE AND DIET:  We recommended that you start or continue a regular exercise program for good health. Regular exercise means any activity that makes your heart beat faster and makes you sweat.  We recommend exercising at least 30 minutes per day at least 3 days a week, preferably 4 or 5.  We also recommend a diet low in fat and sugar.  Inactivity, poor dietary choices and obesity can cause diabetes, heart attack, stroke, and kidney damage, among others.    ALCOHOL AND SMOKING:  Women should limit their alcohol intake to no more than 7 drinks/beers/glasses of wine (combined, not each!) per week. Moderation of alcohol intake to this level decreases your risk of breast cancer and liver damage. And of course, no recreational drugs are part of a healthy lifestyle.  And absolutely no smoking or even second hand smoke. Most people know smoking can cause heart and lung diseases, but did you know it also contributes to weakening of your bones? Aging of your skin?  Yellowing of your teeth and nails?  CALCIUM AND VITAMIN D:  Adequate intake of calcium and Vitamin D are recommended.  The  recommendations for exact amounts of these supplements seem to change often, but generally speaking 600 mg of calcium (either carbonate or citrate) and 800 units of Vitamin D per day seems prudent. Certain women may benefit from higher intake of Vitamin D.  If you are among these women, your doctor will have told you during your visit.    PAP SMEARS:  Pap smears, to check for cervical cancer or precancers,  have traditionally been done yearly, although recent scientific advances have shown that most women can have pap smears less often.  However, every woman still should have a physical exam from her gynecologist every year. It will include a breast check, inspection of the vulva and vagina to check for abnormal growths or skin changes, a visual exam of the cervix, and then an exam to evaluate the size and shape of the uterus and ovaries.  And after 60 years of age, a rectal exam  is indicated to check for rectal cancers. We will also provide age appropriate advice regarding health maintenance, like when you should have certain vaccines, screening for sexually transmitted diseases, bone density testing, colonoscopy, mammograms, etc.   MAMMOGRAMS:  All women over 11 years old should have a yearly mammogram. Many facilities now offer a "3D" mammogram, which may cost around $50 extra out of pocket. If possible,  we recommend you accept the option to have the 3D mammogram performed.  It both reduces the number of women who will be called back for extra views which then turn out to be normal, and it is better than the routine mammogram at detecting truly abnormal areas.    COLONOSCOPY:  Colonoscopy to screen for colon cancer is recommended for all women at age 8.  We know, you hate the idea of the prep.  We agree, BUT, having colon cancer and not knowing it is worse!!  Colon cancer so often starts as a polyp that can be seen and removed at colonscopy, which can quite literally save your life!  And if your first  colonoscopy is normal and you have no family history of colon cancer, most women don't have to have it again for 10 years.  Once every ten years, you can do something that may end up saving your life, right?  We will be happy to help you get it scheduled when you are ready.  Be sure to check your insurance coverage so you understand how much it will cost.  It may be covered as a preventative service at no cost, but you should check your particular policy.

## 2024-08-31 DIAGNOSIS — Z0000 Encounter for general adult medical examination without abnormal findings: Secondary | ICD-10-CM | POA: Diagnosis not present

## 2024-09-10 DIAGNOSIS — Z0000 Encounter for general adult medical examination without abnormal findings: Secondary | ICD-10-CM | POA: Diagnosis not present

## 2024-09-17 ENCOUNTER — Other Ambulatory Visit: Payer: Self-pay | Admitting: Obstetrics and Gynecology

## 2024-09-17 DIAGNOSIS — Z0000 Encounter for general adult medical examination without abnormal findings: Secondary | ICD-10-CM

## 2024-09-19 ENCOUNTER — Ambulatory Visit
Admission: RE | Admit: 2024-09-19 | Discharge: 2024-09-19 | Disposition: A | Discharge status: Home / Self Care | Source: Ambulatory Visit | Attending: Obstetrics and Gynecology | Admitting: Obstetrics and Gynecology

## 2024-09-19 DIAGNOSIS — Z1231 Encounter for screening mammogram for malignant neoplasm of breast: Secondary | ICD-10-CM | POA: Diagnosis not present

## 2024-09-19 DIAGNOSIS — Z0000 Encounter for general adult medical examination without abnormal findings: Secondary | ICD-10-CM

## 2024-09-23 ENCOUNTER — Ambulatory Visit: Payer: Self-pay | Admitting: Obstetrics and Gynecology

## 2024-09-28 ENCOUNTER — Encounter: Payer: Self-pay | Admitting: Internal Medicine

## 2024-12-15 ENCOUNTER — Encounter: Payer: Self-pay | Admitting: Obstetrics and Gynecology

## 2024-12-17 NOTE — Telephone Encounter (Signed)
 Routing to [employer] to update insurance information

## 2024-12-17 NOTE — Telephone Encounter (Signed)
 Spoke with patient. Patient states she has spoken with Dr. Nikki about tapering off of HRT in the past, has been on estradiol  0.025 mg for approximately 1 year. Was due to change patch on Friday. Patient is uncertain if she wants to come completley off of estrogen at this time for hot flashes.   Patient states medication is covered, will cost her more than previous insurance. New insurance information provided to front office to update chart. Patient is going to contact insurance provider for list of covered alternatives, will return call to provide update. Advised I will send to covering provider for review, our office will f/u with any additional recommendations. Patient agreeable.   Routing to Dr. Dallie

## 2025-01-21 ENCOUNTER — Ambulatory Visit: Admitting: Student in an Organized Health Care Education/Training Program

## 2025-08-07 ENCOUNTER — Ambulatory Visit: Admitting: Obstetrics and Gynecology
